# Patient Record
Sex: Male | Born: 1935 | Race: White | Hispanic: No | Marital: Married | Smoking: Never smoker
Health system: Southern US, Community
[De-identification: ages and names within clinical notes are randomized; demographics above are authoritative.]

## PROBLEM LIST (undated history)

## (undated) DIAGNOSIS — D62 Acute posthemorrhagic anemia: Secondary | ICD-10-CM

## (undated) DIAGNOSIS — M1990 Unspecified osteoarthritis, unspecified site: Secondary | ICD-10-CM

## (undated) DIAGNOSIS — E7800 Pure hypercholesterolemia, unspecified: Secondary | ICD-10-CM

## (undated) DIAGNOSIS — K219 Gastro-esophageal reflux disease without esophagitis: Secondary | ICD-10-CM

## (undated) DIAGNOSIS — Z9289 Personal history of other medical treatment: Secondary | ICD-10-CM

## (undated) DIAGNOSIS — Z8619 Personal history of other infectious and parasitic diseases: Secondary | ICD-10-CM

## (undated) DIAGNOSIS — E871 Hypo-osmolality and hyponatremia: Secondary | ICD-10-CM

## (undated) DIAGNOSIS — L0390 Cellulitis, unspecified: Secondary | ICD-10-CM

## (undated) HISTORY — PX: HERNIA REPAIR: SHX51

## (undated) HISTORY — DX: Gastro-esophageal reflux disease without esophagitis: K21.9

## (undated) HISTORY — DX: Pure hypercholesterolemia, unspecified: E78.00

## (undated) HISTORY — DX: Personal history of other medical treatment: Z92.89

## (undated) HISTORY — DX: Personal history of other infectious and parasitic diseases: Z86.19

## (undated) HISTORY — DX: Acute posthemorrhagic anemia: D62

## (undated) HISTORY — DX: Cellulitis, unspecified: L03.90

## (undated) HISTORY — DX: Unspecified osteoarthritis, unspecified site: M19.90

## (undated) HISTORY — PX: KNEE SURGERY: SHX244

## (undated) HISTORY — DX: Hypo-osmolality and hyponatremia: E87.1

## (undated) HISTORY — PX: TRANSURETHRAL RESECTION OF PROSTATE: SHX73

---

## 1997-12-15 ENCOUNTER — Ambulatory Visit (HOSPITAL_COMMUNITY): Admission: RE | Admit: 1997-12-15 | Discharge: 1997-12-15 | Payer: Self-pay | Admitting: Family Medicine

## 1998-05-12 ENCOUNTER — Ambulatory Visit (HOSPITAL_COMMUNITY): Admission: RE | Admit: 1998-05-12 | Discharge: 1998-05-12 | Payer: Self-pay | Admitting: Surgery

## 1999-08-23 ENCOUNTER — Emergency Department (HOSPITAL_COMMUNITY): Admission: EM | Admit: 1999-08-23 | Discharge: 1999-08-23 | Payer: Self-pay | Admitting: Emergency Medicine

## 2002-09-30 ENCOUNTER — Encounter (INDEPENDENT_AMBULATORY_CARE_PROVIDER_SITE_OTHER): Payer: Self-pay | Admitting: Specialist

## 2002-09-30 ENCOUNTER — Ambulatory Visit (HOSPITAL_COMMUNITY): Admission: RE | Admit: 2002-09-30 | Discharge: 2002-09-30 | Payer: Self-pay | Admitting: Gastroenterology

## 2004-12-06 ENCOUNTER — Ambulatory Visit (HOSPITAL_COMMUNITY): Admission: RE | Admit: 2004-12-06 | Discharge: 2004-12-06 | Payer: Self-pay | Admitting: Gastroenterology

## 2005-01-18 ENCOUNTER — Encounter (INDEPENDENT_AMBULATORY_CARE_PROVIDER_SITE_OTHER): Payer: Self-pay | Admitting: *Deleted

## 2005-01-18 ENCOUNTER — Inpatient Hospital Stay (HOSPITAL_COMMUNITY): Admission: RE | Admit: 2005-01-18 | Discharge: 2005-01-20 | Payer: Self-pay | Admitting: Urology

## 2008-09-19 ENCOUNTER — Inpatient Hospital Stay (HOSPITAL_COMMUNITY): Admission: RE | Admit: 2008-09-19 | Discharge: 2008-09-24 | Payer: Self-pay | Admitting: Orthopedic Surgery

## 2010-10-11 LAB — COMPREHENSIVE METABOLIC PANEL
ALT: 22 U/L (ref 0–53)
Alkaline Phosphatase: 56 U/L (ref 39–117)
CO2: 29 mEq/L (ref 19–32)
GFR calc non Af Amer: 50 mL/min — ABNORMAL LOW (ref >60)
Glucose, Bld: 82 mg/dL (ref 70–99)
Potassium: 4.5 mEq/L (ref 3.5–5.1)
Sodium: 142 mEq/L (ref 135–145)
Total Protein: 6.5 g/dL (ref 6.0–8.3)

## 2010-10-11 LAB — BASIC METABOLIC PANEL
BUN: 13 mg/dL (ref 6–23)
CO2: 25 mEq/L (ref 19–32)
CO2: 28 mEq/L (ref 19–32)
CO2: 28 mEq/L (ref 19–32)
Calcium: 7.7 mg/dL — ABNORMAL LOW (ref 8.4–10.5)
Calcium: 7.7 mg/dL — ABNORMAL LOW (ref 8.4–10.5)
Calcium: 7.9 mg/dL — ABNORMAL LOW (ref 8.4–10.5)
Chloride: 101 mEq/L (ref 96–112)
Chloride: 99 mEq/L (ref 96–112)
Creatinine, Ser: 1.13 mg/dL (ref 0.4–1.5)
GFR calc Af Amer: 51 mL/min — ABNORMAL LOW (ref >60)
GFR calc Af Amer: 59 mL/min — ABNORMAL LOW (ref >60)
GFR calc Af Amer: >60 mL/min (ref >60)
GFR calc Af Amer: >60 mL/min (ref >60)
GFR calc Af Amer: >60 mL/min (ref >60)
GFR calc non Af Amer: 48 mL/min — ABNORMAL LOW (ref >60)
GFR calc non Af Amer: 57 mL/min — ABNORMAL LOW (ref >60)
GFR calc non Af Amer: 59 mL/min — ABNORMAL LOW (ref >60)
GFR calc non Af Amer: >60 mL/min (ref >60)
Glucose, Bld: 116 mg/dL — ABNORMAL HIGH (ref 70–99)
Glucose, Bld: 176 mg/dL — ABNORMAL HIGH (ref 70–99)
Glucose, Bld: 189 mg/dL — ABNORMAL HIGH (ref 70–99)
Potassium: 3.8 mEq/L (ref 3.5–5.1)
Potassium: 3.9 mEq/L (ref 3.5–5.1)
Potassium: 4.3 mEq/L (ref 3.5–5.1)
Potassium: 4.6 mEq/L (ref 3.5–5.1)
Potassium: 5.4 mEq/L — ABNORMAL HIGH (ref 3.5–5.1)
Sodium: 130 mEq/L — ABNORMAL LOW (ref 135–145)
Sodium: 131 mEq/L — ABNORMAL LOW (ref 135–145)
Sodium: 132 mEq/L — ABNORMAL LOW (ref 135–145)
Sodium: 132 mEq/L — ABNORMAL LOW (ref 135–145)
Sodium: 135 mEq/L (ref 135–145)
Sodium: 137 mEq/L (ref 135–145)

## 2010-10-11 LAB — CBC
HCT: 25 % — ABNORMAL LOW (ref 39.0–52.0)
HCT: 28.9 % — ABNORMAL LOW (ref 39.0–52.0)
HCT: 29.3 % — ABNORMAL LOW (ref 39.0–52.0)
HCT: 29.8 % — ABNORMAL LOW (ref 39.0–52.0)
Hemoglobin: 10.2 g/dL — ABNORMAL LOW (ref 13.0–17.0)
Hemoglobin: 13.7 g/dL (ref 13.0–17.0)
Hemoglobin: 8.6 g/dL — ABNORMAL LOW (ref 13.0–17.0)
MCHC: 34.5 g/dL (ref 30.0–36.0)
MCV: 96.3 fL (ref 78.0–100.0)
Platelets: 136 10*3/uL — ABNORMAL LOW (ref 150–400)
Platelets: 208 10*3/uL (ref 150–400)
RBC: 2.56 MIL/uL — ABNORMAL LOW (ref 4.22–5.81)
RBC: 3.09 MIL/uL — ABNORMAL LOW (ref 4.22–5.81)
RBC: 4.2 MIL/uL — ABNORMAL LOW (ref 4.22–5.81)
RDW: 12.9 % (ref 11.5–15.5)
RDW: 12.9 % (ref 11.5–15.5)
RDW: 13.2 % (ref 11.5–15.5)
WBC: 4.6 10*3/uL (ref 4.0–10.5)
WBC: 6.9 10*3/uL (ref 4.0–10.5)
WBC: 8 10*3/uL (ref 4.0–10.5)
WBC: 9.6 10*3/uL (ref 4.0–10.5)

## 2010-10-11 LAB — URINALYSIS, ROUTINE W REFLEX MICROSCOPIC
Nitrite: NEGATIVE
Specific Gravity, Urine: 1.018 (ref 1.005–1.030)
pH: 5.5 (ref 5.0–8.0)

## 2010-10-11 LAB — PROTIME-INR
INR: 1.3 (ref 0.00–1.49)
INR: 1.5 (ref 0.00–1.49)
INR: 1.6 — ABNORMAL HIGH (ref 0.00–1.49)
INR: 1.6 — ABNORMAL HIGH (ref 0.00–1.49)
Prothrombin Time: 14.2 seconds (ref 11.6–15.2)
Prothrombin Time: 16.5 seconds — ABNORMAL HIGH (ref 11.6–15.2)
Prothrombin Time: 18.5 seconds — ABNORMAL HIGH (ref 11.6–15.2)
Prothrombin Time: 19.4 seconds — ABNORMAL HIGH (ref 11.6–15.2)

## 2010-10-11 LAB — PREPARE RBC (CROSSMATCH)

## 2010-10-11 LAB — DIFFERENTIAL
Basophils Absolute: 0 10*3/uL (ref 0.0–0.1)
Eosinophils Relative: 0 % (ref 0–5)
Lymphocytes Relative: 10 % — ABNORMAL LOW (ref 12–46)
Lymphs Abs: 1 10*3/uL (ref 0.7–4.0)
Neutro Abs: 7.9 10*3/uL — ABNORMAL HIGH (ref 1.7–7.7)

## 2010-10-11 LAB — TYPE AND SCREEN: Antibody Screen: NEGATIVE

## 2010-11-13 NOTE — Op Note (Signed)
Justin Mccarty, Justin Mccarty                 ACCOUNT NO.:  [id number]   MEDICAL RECORD NO.:  [id number]          PATIENT TYPE:  INP   LOCATION:  0008                         FACILITY:  [facility]   PHYSICIAN:  Ollen Gross, M.D.    DATE OF BIRTH:  1935-07-06   DATE OF PROCEDURE:  09/19/2008  DATE OF DISCHARGE:                               OPERATIVE REPORT   PREOPERATIVE DIAGNOSIS:  Osteoarthritis, bilateral knees.   POSTOPERATIVE DIAGNOSIS:  Osteoarthritis, bilateral knees.   PROCEDURE:  Bilateral total knee arthroplasty.   SURGEON:  Ollen Gross, M.D.   ASSISTANT:  Alexzandrew L. Perkins, P.A.C.   ANESTHESIA:  Spinal epidural.   ESTIMATED BLOOD LOSS:  Minimal.   DRAINS:  Autovac x2, one on each side.   TOURNIQUET TIME:  On the left 47 at 300 mmHg.  On the right 41 at 300  mmHg.   COMPLICATIONS:  None.   CONDITION:  Stable to recovery room.   BRIEF CLINICAL NOTE:  Justin Mccarty is a 75 year old male with severe end-  stage arthritis in both knees, the left slightly more symptomatic than  the right, but both highly symptomatic.  He has failed nonoperative  management and presents now for bilateral total knee arthroplasty.   PROCEDURE IN DETAIL:  After successful administration of combined spinal  and epidural anesthetic, the patient had his tourniquets placed high on  both thighs and both lower extremities were prepped and draped in the  usual sterile fashion.  We did the left side first because he was more  symptomatic on the left.  The left lower extremity was wrapped in  Esmarch, knee flexed, tourniquet inflated to 300 mmHg.  A midline  incision was made with 10 blade through the subcutaneous tissue to the  level of the extensor mechanism.  A fresh blade was used to make a  medial parapatellar arthrotomy.  The soft tissue of the proximal medial  tibia was subperiosteally elevated to the joint line with the knife and  into the semimembranosus bursa with a Cobb elevator.  The soft  tissue  laterally was elevated with attention being paid to avoid the patellar  tendon on the tibial tubercle.  The patella was subluxed laterally, knee  flexed 90 degrees and ACL and PCL removed.  The drill was used create a  starting hole in the distal femur and the canal was thoroughly  irrigated.  The 5 degrees  left valgus alignment guide was placed and  referencing off the posterior condyles rotation was marked and a block  pinned to remove 11 mm of the distal femur.  I took 11 because of a  preop flexion contracture.  The distal femoral resection was made with  an oscillating saw.  The sizing block was placed and size 5 was most  appropriate.  Rotation was marked off the epicondylar axis.  A size 5  cutting block was placed and the anterior, posterior and chamfer cuts  were made.   The tibia was subluxed forward and the menisci removed.  The  extramedullary tibial alignment guide was placed, referencing proximally  at the  medial aspect of the tibial tubercle and distally along the  second metatarsal axis and tibial crest.  The block was pinned to remove  about 10 mm of the non-deficient lateral side.  Tibial resection was  made with an oscillating saw.  A size 4 was the most appropriate tibial  component and the proximal tibia was prepared with the modular drill and  keel punch for the size 4.  Femoral preparation was completed the  intercondylar cut for the size 5.   A size 4 mobile bearing tibial trial, a size 5 posterior stabilized  femoral trial and the 10 mm posterior stabilized rotating platform  insert trial are placed.  With the 10 full extension was achieved with  excellent varus, valgus, anterior and posterior balance throughout full  range of motion.  The patella was then everted, thickness measured to 27  mm.  A freehand resection was taken to 15 mm, a 41 template was placed,  lug holes were drilled, trial patella was placed and it tracks normally.  Osteophytes were  removed off the posterior femur with the trial in  place.  All trials were removed and the cut bone surfaces were prepared  with pulsatile lavage.  Cement was mixed and once ready for implantation  the size 5 posterior stabilized femur, size 4 mobile bearing tibial tray  and 41 patella were cemented into place.  The patella was held with a  clamp.  A trial 10 mm insert was placed, knee held in full extension and  all extruded cement removed.  When the cement was fully hardened then  the permanent 10 mm posterior stabilized rotating platform insert was  placed into the tibial tray.  The wound was copiously irrigated with  saline solution and then the arthrotomy closed over an Autovac drain  with interrupted #1 PDS.  Flexion against gravity was 140 degrees.  The  tourniquet was released for total time of 47 minutes.  The subcu was  then closed with interrupted 2-0 Vicryl and subcuticular running 4-0  Monocryl.  A moist sponge was placed and then the right lower extremity  was addressed.   The right leg was wrapped in Esmarch, knee flexed, tourniquet inflated  to 300 mmHg.  A standard midline incision was made with a 10 blade  through subcutaneous tissue to the level of the extensor mechanism.  A  medial arthrotomy was performed with a fresh blade and soft tissue  releases were performed.  The knee was flexed to 90 degrees.  The  patella was subluxed laterally and ACL and PCL were removed.  The drill  was used create a starting hole in the distal femur and the canal was  thoroughly irrigated.  The 5 degrees right valgus alignment guide was  placed referencing off the posterior condyle, the rotation was marked  and a block pinned to remove 10 mm of the distal femur.  The distal  femoral resection was made with an oscillating saw.  The sizing block  was placed.  A size 5 was the most appropriate.  Rotation was marked at  the epicondylar axis.  A size 5 cutting block was placed and the   anterior, posterior and chamfer cuts were made.   The tibia was subluxed forward and menisci removed.  The extramedullary  tibial alignment guide was placed referencing proximally at the medial  aspect of the tibial tubercle and distally along the second metatarsal  axis and tibial crest.  The block was pinned to  remove 10 mm from the  non-deficient lateral side.  Tibial resection was performed with an  oscillating saw.  A size 4 was the most appropriate tibial component and  the proximal tibia was prepared with the modular drill and keel punch  for the size 4.  Femoral preparation was completed the intercondylar cut  for the size 5.   A size 4 mobile bearing tibial trial, size 5 posterior stabilized  femoral trial and a 10 mm posterior stabilized rotating platform insert  trial are placed.  With the 10 full extension was achieved with  excellent varus, valgus and anterior and posterior balance throughout  full range of motion.  The patella was everted, thickness measured to be  27 mm.  The freehand resection taken to 15 mm, 41 template was placed,  lug holes were drilled, trial patella was placed and it tracks normally.  Osteophytes were removed off the posterior femur with the trial in  place.  All trials were removed and the cut bone surfaces were prepared  with pulsatile lavage.  Cement was mixed and once ready for implantation  the size 4 mobile bearing tibial, tray size 5 posterior stabilized femur  and 41 patella were cemented into place and the patella was held with a  clamp.  The trial 10 mm insert was placed, the knee held in full  extension and all extruded cement removed.  Once the cement was fully  hardened then the permanent 10 mm posterior stabilized rotating platform  insert was placed into the tibial tray.  The wound was copiously  irrigated with saline solution and then the arthrotomy closed with  interrupted #1 PDS.  Flexion against gravity was about 140 degrees.   The  subcu was closed with interrupted 2-0 Vicryl and subcuticular running 4-  0 Monocryl.  The incision was cleaned and dried and Steri-Strips and a  bulky sterile dressing were applied.  On the left we then placed Steri-  Strips and bulky sterile dressing also.  He was placed into bilateral  knee immobilizers, awakened and transported to recovery in stable  condition.      Ollen Gross, M.D.  Electronically Signed     FA/MEDQ  D:  09/19/2008  T:  09/19/2008  Job:  [id number]

## 2010-11-13 NOTE — H&P (Signed)
Justin, Mccarty                 ACCOUNT NO.:  [id number]   MEDICAL RECORD NO.:  [id number]          PATIENT TYPE:  INP   LOCATION:                               FACILITY:  [facility]   PHYSICIAN:  Ollen Gross, M.D.    DATE OF BIRTH:  10-Jun-1936   DATE OF ADMISSION:  09/19/2008  DATE OF DISCHARGE:                              HISTORY & PHYSICAL   DATE OF OFFICE VISIT/HISTORY AND PHYSICAL:  September 15, 2008.   DATE OF ADMISSION:  September 19, 2008.   CHIEF COMPLAINT:  Bilateral knee pain.   HISTORY OF PRESENT ILLNESS:  The patient is a 75 year old male who was  seen by Dr. Lequita Halt in consultation for bilateral knee pain.  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  Over time the knees have progressively gotten worse.  He is seen  in the office and found to have end-stage arthritis of both knees.  He  is at a point where he would now like to have something done about it.  Risks and benefits have been discussed.  He elects to proceed to  surgery.  He has been seen by Dr. Clarene Duke and also by Dr. Swaziland for a  cardiac clearance and has elected to proceed with surgery.   ALLERGIES:  No known drug allergies.   CURRENT MEDICATIONS:  Glucosamine, omega III fish oil and ibuprofen.   PAST MEDICAL HISTORY:  History of bronchitis, reflux disease, history of  phlebitis.   PAST SURGICAL HISTORY:  Hernia repair 2002 and TURP procedure in 2006.   FAMILY HISTORY:  Sister with colon cancer.   SOCIAL HISTORY:  Married, Music therapist.  Nonsmoker, no  alcohol.  Two children.  Wife will be assisting with care after surgery,  He has a one-story home with one step.   REVIEW OF SYSTEMS:  GENERAL:  No fevers, chills, night sweats.  NEURO:  Little bit of hearing loss.  No seizures, syncope or paralysis.  RESPIRATORY:  No shortness breath, productive cough or hemoptysis.  CARDIOVASCULAR:  No chest pain, angina, or orthopnea.  GI:  No nausea,  vomiting, diarrhea, constipation.  GU:  No dysuria, hematuria or  discharge.  MUSCULOSKELETAL:  Bilateral knee pain.   PHYSICAL EXAMINATION:  VITAL SIGNS:  Pulse 64, respirations 14, blood  pressure 126/72.  GENERAL:  75 year old white male, well-nourished, well-developed, no  acute distress.  He is alert, oriented and cooperative, pleasant,  accompanied by his wife.  HEENT:  Normocephalic, atraumatic.  Pupils are round and reactive.  EOMs  intact.  NECK:  Supple.  No carotid bruits.  CHEST:  Clear anterior and posterior chest walls.  No rhonchi, rales or  wheezing.  HEART:  Regular rate and rhythm.  No murmur, S1, S2 noted.  ABDOMEN:  Soft, nontender.  Bowel sounds present.  BREASTS/RECTAL/GENITALIA:  Not done.  Not pertinent to present illness.  EXTREMITIES:  Right knee:  Valgus malalignment 40, marked crepitus.  Range of motion  5-125.  Left knee:  Valgus malalignment deformity,  marked crepitus.  Range of motion 10-120.   IMPRESSION:  Osteoarthritis bilateral knees.   PLAN:  The patient will be admitted to [facility] to undergo a  bilateral total knee arthroplasty procedure.  Surgery will be performed  by Dr. Ollen Gross.      Justin Mccarty, P.A.C.      Ollen Gross, M.D.  Electronically Signed    ALP/MEDQ  D:  09/18/2008  T:  09/18/2008  Job:  [id number]   cc:   Ollen Gross, M.D.  Fax: [phone]   Aida Puffer, Dr.  [facility]  [address]  [place], [place] [address]  Phone:  [phone]   Peter M. Swaziland, M.D.  Fax: [phone]

## 2010-11-16 NOTE — Op Note (Signed)
   NAME:  Justin Mccarty, Justin Mccarty                           ACCOUNT NO.:  [id number]   MEDICAL RECORD NO.:  [id number]                   PATIENT TYPE:  AMB   LOCATION:  ENDO                                 FACILITY[facility]  [facility]   PHYSICIAN:  John C. Madilyn Fireman, M.D.                 DATE OF BIRTH:  06-08-1936   DATE OF PROCEDURE:  09/30/2002  DATE OF DISCHARGE:                                 OPERATIVE REPORT   PROCEDURE:  Colonoscopy with polypectomy.   INDICATIONS:  Heme-positive stools and a family history of colon cancer in a  first degree relative.   DESCRIPTION OF PROCEDURE:  The patient was placed in the left lateral  decubitus position and placed on the pulse monitor with continuous low-flow  oxygen delivered by nasal cannula.  He was sedated with 50 mg IV Demerol and  4 mg IV Versed.  The Olympus video colonoscope was inserted into the rectum  and advanced to the cecum, confirmed by transillumination of McBurney's  point and visualization of the ileocecal valve and appendiceal orifice.  The  prep was excellent.  The cecum, ascending, transverse, and descending colon  appeared normal with no masses, polyps, diverticula, or other mucosal  abnormalities.  There were a few small sigmoid diverticula noted.  At the  rectosigmoid junction with the proximal rectum, there was a vaguely-  delineated, relatively flat sessile polyp approximately 1.25 cm in diameter.  I elected to inject 2 mL of normal saline underneath it to elevate it and  then removed it in one piece with the snare.  There was a 6 mm sessile polyp  about 5 cm distal to the larger polyp, and it was fulgurated by hot biopsy.  The remainder of the rectum appeared normal.  The scope was then withdrawn  and the patient returned to the recovery room in stable condition.  He  tolerated the procedure well, and there were no immediate complications.   IMPRESSION:  1. Sessile rectosigmoid polyp.  2. Diminutive rectal polyp.   PLAN:  Await  histology to determine method and interval for future colon  screening.                                               John C. Madilyn Fireman, M.D.    JCH/MEDQ  D:  09/30/2002  T:  09/30/2002  Job:  [id number]   cc:   Aida Puffer  [address]  [place]  [place] [address]  Fax: [phone]

## 2010-11-16 NOTE — Discharge Summary (Signed)
[name], [name]                 ACCOUNT NO.:  [id number]   MEDICAL RECORD NO.:  [id number]          PATIENT TYPE:  INP   LOCATION:  1614                         [facility]ACILITY:  [facility]   PHYSICIAN:  Ollen Gross, M.D.    DATE OF BIRTH:  1936-05-15   DATE OF ADMISSION:  09/19/2008  DATE OF DISCHARGE:  09/24/2008                               DISCHARGE SUMMARY   ADMISSION DIAGNOSES:  1. Osteoarthritis of bilateral knees.  2. History of bronchitis.  3. Reflux disease.  4. History of phlebitis.   DISCHARGE DIAGNOSES:  1. Osteoarthritis of bilateral knees status post bilateral total      replacement arthroplasty.  2. Postoperative acute blood loss anemia.  3. Status post transfusion without sequelae.  4. Postoperative transient hyperkalemia, resolved.  5. Postoperative hyponatremia, improving.  6. History of bronchitis.  7. Reflux disease.  8. History of phlebitis.   PROCEDURE:  On September 19, 2008:  Bilateral total knee replacement  arthroplasty.   SURGEON:  Ollen Gross, M.D.   ASSISTANT:  Alexzandrew L. Perkins, P.A.C.   ANESTHESIA:  Spinal anesthesia supplemented by epidural per anesthesia.   CONSULTATIONS:  Anesthesia Department for epidural maintenance.   BRIEF HISTORY:  Justin Mccarty is a 75 year old male with end-stage  arthritis of both knees, left slightly more than the right, but both  highly symptomatic, failed operative management and now presents for  total knee arthroplasty.   LABORATORY DATA:  Preoperative CBC showed a hemoglobin of 13.7,  hematocrit of 39.7, white cell count 406, red blood cell 188,  postoperative hemoglobin 10.2, drifting down to 8.6, given 2 units of  blood post-procedure, hemoglobin back up to 10, last known H and H 9.7  and 28.9, pro-time INR preoperative 14.2 and 1.1 with a PTT of 25.  Serial pro-times followed per Coumadin protocol.  Last known PT/INR 17.0  and 1.3.  Chem panel on admission all within normal limits.  Serial  BMETs were  followed.  Potassium did go up from 4.5 to 5.4.  Specimen was  hemolyzed, so its transient, back down to 4.3.  Potassium came down to  normal at 3.9.  Sodium started at 142, got as low as 130, back up to  132.  Preop UA negative.  Blood type A+.   DIAGNOSTICS:  EKG dated September 08, 2008:  Sinus rhythm, nonspecific ST/T  wave changes, possible anterior ischemia, confirmed by Dr. Clarene Duke.   HOSPITAL COURSE:  The patient was admitted to [facility],  taken to the OR and underwent the above stated procedure without  complication.  The patient tolerated the procedure well.  Later  transferred to the recovery room on the orthopedic floor.  He had  epidural placed at the time of surgery that assisted in postop pain  management.  He was also given PCA for postoperative control.  He had a  blood draw which did show on day 1 an elevated potassium of 5.4 felt to  be powerfully hemolyzed.  We rechecked it and it was normal.  Started on  Coumadin on the evening of day 1 with the  epidural.  We were not going  to start the Lovenox till the epidural was out.  He got out of bed on  day 1.  By day 2, he was doing pretty well.  One leg was a little bit  more sore on the right as opposed to the left, but he was actually  getting excellent control per the epidural.  He was seen on rounds on  day 2 and his INR went up to 1.7 indicating he was very sensitive to the  Coumadin and responded to it very quickly, faster than usual.  Anesthesia did elevate him and was concerned about pulling the epidural  with the INR up to 1.7, so he was given a dose of vitamin K to bring it  back down to a normal level, that was rechecked.  Wanted to wait until  his epidural was out before we started the Lovenox.  Both dressings were  changed and both incisions were healing very well.  Luckily, the INR did  come back down and the epidural was pulled by day 3 by anesthesia.  So  we waited 6 hours until after the epidural was  pulled.  We started his  Lovenox on day 3.  Both incisions were healing well.  It was noted  though on postop day 2, that his hemoglobin was down to 8.6.  Due to the  significant change in his hemoglobin down to 8.6 and also having  bilateral total knees, it was felt he would best be served by undergoing  transfusion.  He did receive 2 units of blood.  On the morning of day 3,  his hemoglobin was back up to 10.   Unfortunately with all the fluids that he got postoperatively for the  first several days, his sodium diluted down to 131.  We KVO'd his  fluids.  Once he was off the PCA, we discontinued his fluids and  rechecked his hemoglobin and his sodium.  His hemoglobin was doing well  at 9.7.  His sodium had come back up to 131, it was last noted at 132.  He was doing pretty well with his therapy, slowly progressing.  By day 3  and day 4, he was up walking about 150 feet.  Incisions were healing  well.  Then eventually he got up to walking about 200 feet.  His PCA was  eventually pulled on the morning of day 4.  His sodium was better.  Incisions continued to heal well.  Once he was able to tolerate his p.o.  pain medications for about 24 hours, on the morning of September 24, 2008,  he was seen back in rounds by Dr. Lequita Halt and he was ready to go home.  Unfortunately, his INR had not come back up to a therapeutic range.  He  was still about 1.3.  He was already on Lovenox and then continued  Lovenox for several days at home.  The patient was doing well.   DISPOSITION:  He was discharged home on September 24, 2008.   DISCHARGE MEDICATIONS:  1. Percocet.  2. Robaxin.  3. Coumadin.  4. Lovenox.   DIET:  As tolerated.   ACTIVITY:  He is weightbearing as tolerated, total knee protocol.  Home  health PT and home health nursing.  Home health PT for range of motion,  strengthening exercises.   FOLLOW UP:  Follow up in 2 weeks from date of surgery.   CONDITION ON DISCHARGE:   Improving.  Alexzandrew L. Perkins, P.A.C.      Ollen Gross, M.D.  Electronically Signed    ALP/MEDQ  D:  10/06/2008  T:  10/06/2008  Job:  [id number]   cc:   Ollen Gross, M.D.  Fax: [phone]   Aida Puffer  Fax: [phone]   Peter M. Swaziland, M.D.  Fax: [phone]

## 2010-11-16 NOTE — Op Note (Signed)
NAME:  Justin Mccarty, Justin Mccarty                 ACCOUNT NO.:  [id number]   MEDICAL RECORD NO.:  [id number]          PATIENT TYPE:  AMB   LOCATION:  ENDO                         FACILITY:  [facility]   PHYSICIAN:  John C. Madilyn Fireman, M.D.    DATE OF BIRTH:  [birth year]   DATE OF PROCEDURE:  12/06/2004  DATE OF DISCHARGE:                                 OPERATIVE REPORT   PROCEDURE:  Esophagogastroduodenoscopy with biopsy.   INDICATIONS FOR PROCEDURE:  Heme-positive stools and history of adenomatous  colon polyps.   DESCRIPTION OF PROCEDURE:  The patient was placed in the left lateral  decubitus position and placed on the pulse monitor with continuous low-flow  oxygen delivered by nasal cannula. He was sedated with 50 mcg IV fentanyl  and 5 mg IV Versed. The Olympus video endoscope was advanced under direct  vision into the oropharynx and esophagus. The esophagus was straight and of  normal caliber with the squamocolumnar line at 38 cm. There was no visible  hiatal hernia,  ring stricture or other abnormality of the GE junction. The  stomach was entered and a small amount of liquid secretions were suctioned  from the fundus. Retroflexed view of cardia was unremarkable. The fundus and  body appeared normal. Within the antrum,  there were seen three fairly small  prepyloric ulcers with no pyloric deformity.  The largest of these was about  6 mm in diameter and had a small flat nonbleeding visible vessel. The other  two were approximately 3 and 4 mm in diameter with no stigma of hemorrhage.  A CLO-test was obtained. The duodenum was entered and both the bulb and  second portion were inspected and appeared to be within normal limits. The  scope was withdrawn back into the stomach and a CLO-test obtained. The scope  was then withdrawn and the patient returned to the recovery room in stable  condition. He tolerated the procedure well and there were no immediate  complications.   IMPRESSION:  Three prepyloric  gastric ulcers.   PLAN:  1.  Will treat with proton pump inhibitor and antibiotics if CLO-test is      positive.  2.  Proceed to colonoscopy.       JCH/MEDQ  D:  12/06/2004  T:  12/06/2004  Job:  [id number]   cc:   Aida Puffer  [address]  [place]  [place] [address]  Fax: [phone]

## 2010-11-16 NOTE — H&P (Signed)
NAMEJORON, VELIS                 ACCOUNT NO.:  [id number]   MEDICAL RECORD NO.:  [id number]          PATIENT TYPE:  INP   LOCATION:  0002                         FACILITY:  [facility]   PHYSICIAN:  Jamison Neighbor, M.D.  DATE OF BIRTH:  1935-11-30   DATE OF ADMISSION:  01/18/2005  DATE OF DISCHARGE:                                HISTORY & PHYSICAL   HISTORY OF PRESENT ILLNESS:  This 75 year old male has had problems with  bladder outlet symptoms. He has been on a combination of Proscar and  Avodart. He was recently found to have some nodularity of the prostate and  underwent a biopsy which was negative for cancer. Because the patient's  residual urine has gotten as high as 358 mL, it was felt that he needed to  have something done. He did not like the idea of TUNA or microwave as he  wished to have the most complete procedure as possible and he has elected to  undergo a TURP. He is to be admitted following that procedure.   The patient's past medical history is otherwise quite unremarkable. His only  medications are Flomax and Avodart, both of which can be discontinued  following the TURP. He has no significant health problems. He did have  esophageal reflux in the past. He has been under the care of Dr. Dorena Cookey  who recently performed an endoscopy and colonoscopy. The patient's previous  surgery includes double hernia repair a number of years ago as well as knee  surgery approximately 35 years ago.   SOCIAL HISTORY:  Unremarkable. He does not use tobacco or alcohol.   FAMILY HISTORY:  Reviewed and were noncontributory.   REVIEW OF SYMPTOMS:  Reviewed and were noncontributory.   PHYSICAL EXAMINATION:  GENERAL:  He is a well-developed, well-nourished male  in no acute distress.  VITAL SIGNS:  Temperature 97, pulse 68, respirations 16, blood pressure  128/64.  HEENT:  Normocephalic, atraumatic. Cranial nerves II-XII appear grossly  intact.  NECK:  Supple with no adenopathy or  thyromegaly.  HEART:  Regular rate and rhythm without murmurs, thrills, gallops, rubs, or  heaves.  LUNGS:  Clear.  ABDOMEN:  Soft, nontender with no palpable masses, rebound or guarding.  GU:  Shows normal testicles bilaterally with no hydrocele, spermatocele,  varicocele, hernia or adenopathy. Penis is free of any lesions.  RECTAL:  Shows a 2+ prostate previously biopsied and shown to be benign. The  sphincter tone is normal.  EXTREMITIES:  No cyanosis, clubbing or edema.  NEUROLOGIC/VASCULAR:  Intact.   IMPRESSION:  Benign prostatic hypertrophy with bladder outlet obstruction  with high post void residual of 358 mL.   PLAN:  Admit for TURP.     _______________    RJE/MEDQ  D:  01/18/2005  T:  01/18/2005  Job:  [id number]

## 2010-11-16 NOTE — Op Note (Signed)
NAME:  Justin Mccarty, Justin Mccarty                 ACCOUNT NO.:  [id number]   MEDICAL RECORD NO.:  [id number]          PATIENT TYPE:  AMB   LOCATION:  ENDO                         FACILITY:  [facility]   PHYSICIAN:  John C. Madilyn Fireman, M.D.    DATE OF BIRTH:  May 28, 1936   DATE OF PROCEDURE:  12/06/2004  DATE OF DISCHARGE:                                 OPERATIVE REPORT   PROCEDURE:  Colonoscopy.   INDICATIONS FOR PROCEDURE:  History of adenomatous colon polyps with heme-  positive stool.   DESCRIPTION OF PROCEDURE:  The patient was placed in the left lateral  decubitus position and placed on the pulse monitor with continuous low-flow  oxygen delivered by nasal cannula. He was sedated with 50 mcg IV fentanyl  and 5 mg IV Versed for the previous EGD and no further sedation was required  for this procedure. The Olympus video colonoscope was inserted into the  rectum and advanced to the cecum, confirmed by transillumination at  McBurney's point and visualization of ileocecal valve and appendiceal  orifice. The prep was good. The cecum, ascending, transverse, descending and  sigmoid colon all appeared normal with no masses, polyps, diverticula or  other mucosal abnormalities. The rectum likewise appeared normal and  retroflexed view of the anus revealed no obvious internal hemorrhoids. The  scope was then withdrawn and the patient returned to the recovery room in  stable condition. He tolerated the procedure well. There were no immediate  complications.   IMPRESSION:  Normal colonoscopy.   PLAN:  Repeat colonoscopy in three years.       JCH/MEDQ  D:  12/06/2004  T:  12/06/2004  Job:  [id number]   cc:   Aida Puffer  [address]  [place]  [place] [address]  Fax: [phone]

## 2010-11-16 NOTE — Op Note (Signed)
Justin Mccarty, Justin Mccarty                 ACCOUNT NO.:  [id number]   MEDICAL RECORD NO.:  [id number]          PATIENT TYPE:  INP   LOCATION:  0002                         [facility]ACILITY:  [facility]   PHYSICIAN:  Jamison Neighbor, M.D.  DATE OF BIRTH:  1936-03-28   DATE OF PROCEDURE:  01/18/2005  DATE OF DISCHARGE:                                 OPERATIVE REPORT   SERVICE:  Urology.   PREOPERATIVE DIAGNOSIS:  Benign prostatic hypertrophy with bladder outlet  obstruction.   POSTOPERATIVE DIAGNOSIS:  Benign prostatic hypertrophy with bladder outlet  obstruction.   PROCEDURE:  Cystoscopy and TURP.   SURGEON:  Jamison Neighbor, M.D.   ANESTHESIA:  General.   COMPLICATIONS:  None.   DRAINS:  None.   BRIEF HISTORY:  This 75 year old male has a history of BPH with bladder  outlet obstruction. The patient has been on Proscar and Flomax. The patient  was recently found to have some nodularity on rectal examination. _TRUS  guided _________ biopsy was performed and there was no evidence of cancer.  The patient does have residual urines as high as 358 mL despite the use of  both Flomax and Avodart. Because of the high post void residual, he  certainly is not a candidate for anticholinergic therapy. The patient does  know that there are options available to him such as TUNA or microwave, but  he has elected to undergo a TURP in order to be sure that tissue is  available for the pathologist to review. He understands the risks and  benefits of the procedure including the chance of both impotence and  incontinence. Informed consent was obtained.   DESCRIPTION OF PROCEDURE:  After successful induction of general anesthesia,  the patient was placed in the dorsal lithotomy position, prepped with  Betadine and draped in the usual sterile fashion. The urethra was dilated to  30 [place] with [employer]. The continuous flow resectoscope sheath was  then inserted using a [place] obturator. The __________  resectoscope was  inserted with the 12 degree lens in place. The bladder was inspected and was  free of any tumor or stones. Both ureteral orifices were normal in  configuration and location. The patient was quite trabeculated with a very  tense and tight bladder neck. The prostate itself was not particularly  enlarged but the patient had showed clear evidence of bladder outlet  obstructive changes in the bladder. The median lobe was carefully resected  beginning at the 6 o'clock position extending back to the verumontanum. The  right lateral lobe was dissected starting at the roof of the prostate  extending down to the floor. The left lateral lobe was extended in identical  fashion. Resection was taken out as far as the verumontanum. Because of the  small nature of the prostate, extra care was taken to ensure that the  sphincter mechanism was not damaged in anyway. The chips were irrigated from  the bladder, adequate hemostasis was obtained. Final inspection showed a  wide open prostatic fossa with no evidence of undermining the bladder neck  or injury to the  trigone in anyway. The sphincter mechanism and veru  appeared to be intact. The prostatic fossa was wide open, rectal examination  showed little if any residual prostate tissue. The cystoscope was removed,  the 24 [place] three-way catheter was inserted and placed to straight  drainage and will be started on continuous bladder irrigation. The outflow  was clear.     _______________    RJE/MEDQ  D:  01/18/2005  T:  01/18/2005  Job:  [id number]

## 2012-04-01 ENCOUNTER — Encounter: Payer: Self-pay | Admitting: Cardiovascular Disease

## 2012-08-24 ENCOUNTER — Encounter: Payer: Self-pay | Admitting: Gastroenterology

## 2012-08-25 ENCOUNTER — Encounter: Payer: Self-pay | Admitting: Cardiology

## 2012-08-25 ENCOUNTER — Ambulatory Visit (INDEPENDENT_AMBULATORY_CARE_PROVIDER_SITE_OTHER): Payer: Medicare Other | Admitting: Cardiology

## 2012-08-25 VITALS — BP 116/70 | HR 76 | Ht 69.5 in | Wt 195.6 lb

## 2012-08-25 DIAGNOSIS — I498 Other specified cardiac arrhythmias: Secondary | ICD-10-CM

## 2012-08-25 DIAGNOSIS — R001 Bradycardia, unspecified: Secondary | ICD-10-CM | POA: Insufficient documentation

## 2012-08-25 NOTE — Progress Notes (Signed)
   Justin Mccarty Date of Birth: [birth year] Medical Record #[id number]  History of Present Illness: Justin Mccarty is seen today at the request of Dr. little for evaluation of bradycardia. He is a pleasant 77 year old white male that I saw about 4 years ago for preoperative clearance for knee surgery. At that time his cardiac evaluation was unremarkable. He had a sinus bradycardia at bedtime with a rate of 54 beats per minute. Recently he had a EKG done which showed a pulse rate of 55. Patient is on no rate slowing medications. He denies any symptoms of bradycardia. He specifically denies any palpitations, dizziness, chest pain, or dyspnea. His energy level is very good he remains very active.  Current Outpatient Prescriptions on File Prior to Visit  Medication Sig Dispense Refill  . Glucosamine-Vitamin D (GLUCOSAMINE PLUS VITAMIN D PO) Take by mouth daily.      Marland Kitchen ibuprofen (ADVIL,MOTRIN) 200 MG tablet Take 200 mg by mouth every 6 (six) hours as needed.      . Omega-3 Fatty Acids (FISH OIL PO) Take by mouth 2 (two) times daily.       No current facility-administered medications on file prior to visit.    No Known Allergies  Past Medical History  Diagnosis Date  . 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 Hypercholesterolemia     Mild  . Cellulitis     History of Cellulitis  . History of sepsis   . Acid reflux disease   . Acute blood loss anemia     Postoperative  . Transfusion history     Status post transfusion without sequelae  . Hyponatremia          Past Surgical History  Procedure Laterality Date  . Knee surgery    . Hernia repair      Abdominal  . Transurethral resection of prostate      History  Smoking status  . Never Smoker   Smokeless tobacco  . Not on file    History  Alcohol Use No    Family History  Problem Relation Age of Onset  . Bone cancer Mother   . Colon cancer Sister     Review of Systems: As noted in history of present illness..  All other systems  were reviewed and are negative.  Physical Exam: BP 116/70  Pulse 76  Ht 5' 9.5" (1.765 m)  Wt 195 lb 9.6 oz (88.724 kg)  BMI 28.48 kg/m2  SpO2 93% He is a pleasant white male in no acute distress. HEENT: Normal. No JVD or bruits. Lungs: Clear Cardiovascular: Regular rate and rhythm without gallop, murmur, or click. S1 and S2 are normal. Abdomen: Soft and nontender. No masses or bruits. Extremities: Normal pedal pulses. No edema. Neuro: Alert and oriented x3. Cranial nerves II through XII are intact. Gait is normal.  LABORATORY DATA: Repeat ECG today demonstrates sinus bradycardia with occasional PAC. Rate is 54 beats per minute. It is otherwise normal. Laboratory data from 06/30/2012 shows a normal chemistry panel and CBC. Total cholesterol 195, triglycerides 123, HDL 35, LDL 135. TSH was normal at 2.49.  Assessment / Plan: 1. Sinus bradycardia. Rate is in the physiologic range. Patient is asymptomatic. This was present at least in 2010. No further workup indicated unless patient develops symptoms.

## 2016-06-02 ENCOUNTER — Emergency Department (HOSPITAL_BASED_OUTPATIENT_CLINIC_OR_DEPARTMENT_OTHER): Payer: Medicare Other

## 2016-06-02 ENCOUNTER — Encounter (HOSPITAL_BASED_OUTPATIENT_CLINIC_OR_DEPARTMENT_OTHER): Payer: Self-pay | Admitting: *Deleted

## 2016-06-02 ENCOUNTER — Emergency Department (HOSPITAL_BASED_OUTPATIENT_CLINIC_OR_DEPARTMENT_OTHER)
Admission: EM | Admit: 2016-06-02 | Discharge: 2016-06-03 | Disposition: A | Discharge status: Home / Self Care | Payer: Medicare Other | Attending: Emergency Medicine | Admitting: Emergency Medicine

## 2016-06-02 DIAGNOSIS — T148XXA Other injury of unspecified body region, initial encounter: Secondary | ICD-10-CM

## 2016-06-02 DIAGNOSIS — Y939 Activity, unspecified: Secondary | ICD-10-CM | POA: Diagnosis not present

## 2016-06-02 DIAGNOSIS — Y999 Unspecified external cause status: Secondary | ICD-10-CM | POA: Diagnosis not present

## 2016-06-02 DIAGNOSIS — S0990XA Unspecified injury of head, initial encounter: Secondary | ICD-10-CM | POA: Diagnosis present

## 2016-06-02 DIAGNOSIS — W19XXXA Unspecified fall, initial encounter: Secondary | ICD-10-CM

## 2016-06-02 DIAGNOSIS — W01198A Fall on same level from slipping, tripping and stumbling with subsequent striking against other object, initial encounter: Secondary | ICD-10-CM | POA: Insufficient documentation

## 2016-06-02 DIAGNOSIS — S62111A Displaced fracture of triquetrum [cuneiform] bone, right wrist, initial encounter for closed fracture: Secondary | ICD-10-CM | POA: Insufficient documentation

## 2016-06-02 DIAGNOSIS — Y929 Unspecified place or not applicable: Secondary | ICD-10-CM | POA: Diagnosis not present

## 2016-06-02 DIAGNOSIS — S01511A Laceration without foreign body of lip, initial encounter: Secondary | ICD-10-CM | POA: Insufficient documentation

## 2016-06-02 DIAGNOSIS — Z23 Encounter for immunization: Secondary | ICD-10-CM | POA: Insufficient documentation

## 2016-06-02 DIAGNOSIS — S61411A Laceration without foreign body of right hand, initial encounter: Secondary | ICD-10-CM | POA: Diagnosis not present

## 2016-06-02 DIAGNOSIS — S60311A Abrasion of right thumb, initial encounter: Secondary | ICD-10-CM | POA: Insufficient documentation

## 2016-06-02 MED ORDER — LIDOCAINE-EPINEPHRINE 2 %-1:100000 IJ SOLN: 10.0000 mL | Freq: Once | INTRAMUSCULAR | Status: DC

## 2016-06-02 MED ORDER — LIDOCAINE-EPINEPHRINE (PF) 2 %-1:200000 IJ SOLN
INTRAMUSCULAR | Status: AC
  Filled 2016-06-02: qty 20

## 2016-06-02 NOTE — ED Provider Notes (Signed)
[facility] Provider Note   CSN: [id number] Arrival date & time: 06/02/16  2038  By signing my name below, I, Justin Mccarty, attest that this documentation has been prepared under the direction and in the presence of Nira ConnPedro Eduardo Cardama, MD. Electronically Signed: Doreatha MartinEva Mccarty, ED Scribe. 06/02/16. 9:37 PM.     History   Chief Complaint Chief Complaint  Patient presents with  . Fall    HPI Justin Mccarty is a 80 y.o. male who presents to the Emergency Department for evaluation of injuries s/p mechanical fall that occurred 1.5 hours ago. Pt states he tripped over a parking curb and fell forward, striking his face, chest, palms and knees on the concrete. He denies LOC. He was ambulatory after the fall without difficulty. Pt currently complains of abrasions to the lower lip, bilateral palms and knees. He also complains of chest wall pain. He also notes his lower central teeth seem a little loose. Pt is not currently on any anticoagulants. He denies neck pain, HA, leg pain, abdominal pain, hip pain, additional injuries.   The history is provided by the patient. No language interpreter was used.    Past Medical History:  Diagnosis Date  . Acid reflux disease   . Acute blood loss anemia    Postoperative  . Cellulitis    History of Cellulitis  . History of sepsis   . Hypercholesterolemia    Mild  . Hyponatremia       . 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 Transfusion history    Status post transfusion without sequelae    Patient Active Problem List   Diagnosis Date Noted  . Sinus bradycardia 08/25/2012    Past Surgical History:  Procedure Laterality Date  . HERNIA REPAIR     Abdominal  . KNEE SURGERY    . TRANSURETHRAL RESECTION OF PROSTATE         Home Medications    Prior to Admission medications   Medication Sig Start Date End Date Taking? Authorizing Provider  acetaminophen (TYLENOL) 500 MG tablet Take 2 tablets (1,000 mg total) by mouth every 8  (eight) hours. Do not take more than 4000 mg of acetaminophen (Tylenol) in a 24-hour period. Please note that other medicines that you may be prescribed may have Tylenol as well. 06/03/16 06/08/16  Nira ConnPedro Eduardo Cardama, MD  Cyanocobalamin (VITAMIN B 12 PO) Take by mouth.    Historical Provider, MD  Glucosamine-Vitamin D (GLUCOSAMINE PLUS VITAMIN D PO) Take by mouth daily.    Historical Provider, MD  ibuprofen (ADVIL,MOTRIN) 200 MG tablet Take 200 mg by mouth every 6 (six) hours as needed.    Historical Provider, MD  Omega-3 Fatty Acids (FISH OIL PO) Take by mouth 2 (two) times daily.    Historical Provider, MD    Family History Family History  Problem Relation Age of Onset  . Bone cancer Mother   . Colon cancer Sister     Social History Social History  Substance Use Topics  . Smoking status: Never Smoker  . Smokeless tobacco: Never Used  . Alcohol use No     Allergies   Patient has no known allergies.   Review of Systems Review of Systems A complete 10 system review of systems was obtained and all systems are negative except as noted in the HPI and PMH.    Physical Exam Updated Vital Signs BP 154/82 (BP Location: Right Arm)   Pulse 68   Temp 98.6 F (37  C) (Oral)   Resp 18   Ht 5\' 8"  (1.727 m)   Wt 190 lb (86.2 kg)   SpO2 97%   BMI 28.89 kg/m   Physical Exam  Constitutional: He is oriented to person, place, and time. He appears well-developed and well-nourished. No distress.  HENT:  Head: Normocephalic.  Right Ear: External ear normal.  Left Ear: External ear normal.  Mouth/Throat: Oropharynx is clear and moist.    4 cm laceration on the wet vermilion of the lower lip on the left that involves the vermilion border. No nasal septal hematoma. No racoon eyes, no battle's sign. No hemotympanum bilaterally.   Eyes: Conjunctivae and EOM are normal. Pupils are equal, round, and reactive to light. Right eye exhibits no discharge. Left eye exhibits no discharge. No scleral  icterus.  Neck: Normal range of motion. Neck supple.  Cardiovascular: Normal rate, regular rhythm and normal heart sounds.  Exam reveals no gallop and no friction rub.   No murmur heard. Pulses:      Radial pulses are 2+ on the right side, and 2+ on the left side.       Dorsalis pedis pulses are 2+ on the right side, and 2+ on the left side.  Pulmonary/Chest: Effort normal and breath sounds normal. No stridor. No respiratory distress. He exhibits tenderness.    Lower sternal TTP. No overlying contusions appreciated.  Abdominal: Soft. He exhibits no distension. There is no tenderness.  Musculoskeletal:       Cervical back: He exhibits no bony tenderness.       Thoracic back: He exhibits no bony tenderness.       Lumbar back: He exhibits no bony tenderness.       Hands: 6 cm laceration of the right thenar surface. Abrasion at the base of the right thumb. Contusion on the left thenar prominence. Small avulsion on the left distal thumb.   Pelvis stable. No C, T or L spine tenderness.   Neurological: He is alert and oriented to person, place, and time. GCS eye subscore is 4. GCS verbal subscore is 5. GCS motor subscore is 6.  Moving all extremities   Skin: Skin is warm. He is not diaphoretic.     ED Treatments / Results   DIAGNOSTIC STUDIES: Oxygen Saturation is 97% on RA, normal by my interpretation.    COORDINATION OF CARE: 9:26 PM Discussed treatment plan with pt at bedside which includes imaging, wound care and pt agreed to plan.    Labs (all labs ordered are listed, but only abnormal results are displayed) Labs Reviewed - No data to display  EKG  EKG Interpretation None       Radiology Dg Sternum  Result Date: 06/02/2016 CLINICAL DATA:  Tripped and fell in parking lot, with sternal pain. Initial encounter. EXAM: STERNUM - 2+ VIEW COMPARISON:  Chest radiograph performed 09/13/2008 FINDINGS: The lungs are well-aerated. Minimal bilateral atelectasis is noted. There is  no evidence of pleural effusion or pneumothorax. The heart is normal in size; the mediastinal contour is within normal limits. No acute osseous abnormalities are seen. The sternum appears intact. IMPRESSION: 1. Minimal bilateral atelectasis noted. Lungs otherwise clear. No displaced rib fracture seen. 2. The sternum appears intact. Electronically Signed   By: Roanna Raider M.D.   On: 06/02/2016 22:56   Dg Wrist Complete Right  Result Date: 06/03/2016 CLINICAL DATA:  Trip and fall injury in a parking lot tonight. EXAM: RIGHT WRIST - COMPLETE 3+ VIEW COMPARISON:  None.  FINDINGS: There is fragmentation at the dorsal aspect of the triquetrum, likely an acute fracture. No other area of suspicion for acute fracture. No dislocation. Severe osteoarthritic changes are present at the first carpometacarpal joint. IMPRESSION: Acute fracture of the dorsal triquetrum. Electronically Signed   By: Ellery Plunk M.D.   On: 06/03/2016 00:11   Ct Head Wo Contrast  Result Date: 06/02/2016 CLINICAL DATA:  Tripped over tire block in parking lot, face struck pavement. EXAM: CT HEAD WITHOUT CONTRAST TECHNIQUE: Contiguous axial images were obtained from the base of the skull through the vertex without intravenous contrast. COMPARISON:  None. FINDINGS: BRAIN: The ventricles and sulci are normal for age. No intraparenchymal hemorrhage, mass effect nor midline shift. Old RIGHT caudate head lacunar infarct. Old RIGHT cerebellar small infarct. Patchy supratentorial white matter hypodensities within normal range for patient's age, though non-specific are most compatible with chronic small vessel ischemic disease. No acute large vascular territory infarcts. No abnormal extra-axial fluid collections. Basal cisterns are patent. VASCULAR: Severe calcific atherosclerosis of the carotid siphons. SKULL: No skull fracture. No significant scalp soft tissue swelling. SINUSES/ORBITS: The mastoid air-cells and included paranasal sinuses are  well-aerated. Status post bilateral ocular lens implants. The included ocular globes and orbital contents are non-suspicious. OTHER: None. IMPRESSION: 1. No acute intracranial process. 2. 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 3. Moderate chronic small vessel ischemic disease. Electronically Signed   By: Awilda Metro M.D.   On: 06/02/2016 23:43   Dg Hand Complete Left  Result Date: 06/03/2016 CLINICAL DATA:  Tripped and fell in parking lot. Large RIGHT laceration. EXAM: LEFT HAND - COMPLETE 3+ VIEW COMPARISON:  None. FINDINGS: No acute fracture deformity or dislocation. Severe first carpometacarpal joint space narrowing with periarticular sclerosis and marginal spurring compatible with osteoarthrosis. Moderate second MTP osteoarthrosis. Moderate to severe third through fifth distal interphalangeal joint space osteoarthrosis. No destructive bony lesions. Mild vascular calcifications. Sub cm metallic foreign body projects in the dorsum of the third carpometacarpal joint space soft tissues. IMPRESSION: Degenerative changes without acute fracture deformity or dislocation. Sub cm metallic foreign body within dorsum of wrist. Electronically Signed   By: Awilda Metro M.D.   On: 06/03/2016 00:15   Dg Hand Complete Right  Result Date: 06/03/2016 CLINICAL DATA:  Trip and fall injury in a parking lot tonight EXAM: RIGHT HAND - COMPLETE 3+ VIEW COMPARISON:  None. FINDINGS: Fragmentation at the dorsal aspect of the triquetrum. No other acute fracture. Moderately severe arthritic changes of the first carpometacarpal joint and at many of the distal interphalangeal joints. No radiopaque foreign body. No dislocation. IMPRESSION: Fracture of the dorsal aspect of the triquetrum, seen to better advantage on the accompanying wrist radiographs. No other acute fracture. Moderately severe arthritic changes are present. Electronically Signed   By: Ellery Plunk M.D.   On: 06/03/2016 00:13     Procedures Procedures (including critical care time)  LACERATION REPAIR PROCEDURE NOTE The patient's identification was confirmed and consent was obtained. This procedure was performed by Nira Conn, MD at 1:09 AM. Site: right thenar surface  Sterile procedures observed Anesthetic used (type and amt): 5 mL of lidocaine with epi Suture type/size: 4-0 Prolene Length: 6 cm  # of Sutures: 9 Technique: Simple interrupted, horizontal mattress Complexity simple Antibx ointment applied Tetanus  Ordered  Site anesthetized, irrigated with NS, explored without evidence of foreign body, wound well approximated, site covered with dry, sterile dressing.  Patient tolerated procedure well without complications. Instructions for care discussed verbally  and patient provided with additional written instructions for homecare and f/u.   LACERATION REPAIR PROCEDURE NOTE The patient's identification was confirmed and consent was obtained. This procedure was performed by Nira ConnPedro Eduardo Cardama, MD at 1:09 AM. Site:  Left lower lip Sterile procedures observed Anesthetic used (type and amt): 3 mL of lidocaine with epi Suture type/size: 4-0 Vicryl Length: 4 cm # of Sutures: 6 Technique: Simple interrupted Complexity simple Antibx ointment applied Tetanus  ordered Site anesthetized, irrigated with NS, explored without evidence of foreign body, wound well approximated, site covered with dry, sterile dressing.  Patient tolerated procedure well without complications. Instructions for care discussed verbally and patient provided with additional written instructions for homecare and f/u.    Medications Ordered in ED Medications  lidocaine-EPINEPHrine (XYLOCAINE W/EPI) 2 %-1:100000 (with pres) injection 10 mL (not administered)  lidocaine-EPINEPHrine (XYLOCAINE W/EPI) 2 %-1:200000 (PF) injection (not administered)  Tdap (BOOSTRIX) injection 0.5 mL (not administered)     Initial  Impression / Assessment and Plan / ED Course  I have reviewed the triage vital signs and the nursing notes.  Pertinent labs & imaging results that were available during my care of the patient were reviewed by me and considered in my medical decision making (see chart for details).  Clinical Course     Imaging notable for right triquetrum fracture. Patient placed in a volar splint after laceration was sutured. Foreign body noted in left hand however no injuries to the hand, likely old. CT head without acute findings. Plain film of the sternum without fracture.  Safe for discharge with strict return precautions and close hand follow-up.  Final Clinical Impressions(s) / ED Diagnoses   Final diagnoses:  Fall  Fall, initial encounter  Abrasion  Lip laceration, initial encounter  Laceration of right hand without foreign body, initial encounter  Closed displaced fracture of triquetrum of right wrist, initial encounter   Disposition: Discharge  Condition: Good  I have discussed the results, Dx and Tx plan with the patient who expressed understanding and agree(s) with the plan. Discharge instructions discussed at great length. The patient was given strict return precautions who verbalized understanding of the instructions. No further questions at time of discharge.    New Prescriptions   ACETAMINOPHEN (TYLENOL) 500 MG TABLET    Take 2 tablets (1,000 mg total) by mouth every 8 (eight) hours. Do not take more than 4000 mg of acetaminophen (Tylenol) in a 24-hour period. Please note that other medicines that you may be prescribed may have Tylenol as well.    Follow Up: Dominica SeverinWilliam Gramig, MD [address] [place] [id number] [phone]  Call in 1 day For close follow up to assess for wrist bone fracture and hand laceration   I personally performed the services described in this documentation, which was scribed in my presence. The recorded information has been reviewed  and is accurate.        Nira ConnPedro Eduardo Cardama, MD 06/03/16 [phone]

## 2016-06-02 NOTE — ED Triage Notes (Addendum)
Mechanical fall~ 45 minutes PTA (2000), tripped over tire block in parking lot, (denies: LOC, nv, dizziness, visual changes, head neck or back pain), c/o bilateral hand pain, R hand avulsed (reported/bandaged), sternal tenderness (no abrasion or bruising), nose abrasion and lower lip deep abrasion, teeth not loose, "lower central seems to be maloccluded", TMJ intact w/o crepitus, hand bleeding controlled. No meds PTA. Alert, NAD, calm, interactive, speech clear, steady gait. Family present with pt.

## 2016-06-03 MED ORDER — TETANUS-DIPHTH-ACELL PERTUSSIS 5-2.5-18.5 LF-MCG/0.5 IM SUSP
0.5000 mL | Freq: Once | INTRAMUSCULAR | Status: AC
  Administered 2016-06-03: 0.5 mL via INTRAMUSCULAR
  Filled 2016-06-03: qty 0.5

## 2016-06-03 MED ORDER — ACETAMINOPHEN 500 MG PO TABS: 1000.0000 mg | ORAL_TABLET | Freq: Three times a day (TID) | ORAL | 0 refills | Status: AC

## 2018-01-28 ENCOUNTER — Other Ambulatory Visit: Payer: Self-pay | Admitting: Dermatology

## 2018-04-13 ENCOUNTER — Other Ambulatory Visit: Payer: Self-pay | Admitting: Dermatology

## 2019-07-28 ENCOUNTER — Ambulatory Visit: Payer: Medicare Other

## 2019-08-05 ENCOUNTER — Ambulatory Visit: Payer: Medicare Other | Attending: Internal Medicine

## 2019-08-05 DIAGNOSIS — Z23 Encounter for immunization: Secondary | ICD-10-CM | POA: Insufficient documentation

## 2019-08-05 NOTE — Progress Notes (Signed)
   Covid-19 Vaccination Clinic  Name:  CAYLEB JARNIGAN    MRN: [id number] DOB: 1935-12-26  08/05/2019  Mr. Puder was observed post Covid-19 immunization for 15 minutes without incidence. He was provided with Vaccine Information Sheet and instruction to access the V-Safe system.   Mr. Vangilder was instructed to call 911 with any severe reactions post vaccine: Marland Kitchen Difficulty breathing  . Swelling of your face and throat  . A fast heartbeat  . A bad rash all over your body  . Dizziness and weakness    Immunizations Administered    Name Date Dose VIS Date Route   Pfizer COVID-19 Vaccine 08/05/2019  9:45 AM 0.3 mL 06/11/2019 Intramuscular   Manufacturer: [employer], [employer]   Lot: [id number]   NDC: [address]-0

## 2019-08-30 ENCOUNTER — Ambulatory Visit: Payer: Medicare Other | Attending: Internal Medicine

## 2019-08-30 DIAGNOSIS — Z23 Encounter for immunization: Secondary | ICD-10-CM | POA: Insufficient documentation

## 2019-08-30 NOTE — Progress Notes (Signed)
   Covid-19 Vaccination Clinic  Name:  Justin Mccarty    MRN: [id number] DOB: July 29, 1935  08/30/2019  [name] was observed post Covid-19 immunization for 15 minutes without incidence. He was provided with Vaccine Information Sheet and instruction to access the V-Safe system.   [name] was instructed to call 911 with any severe reactions post vaccine: Marland Kitchen Difficulty breathing  . Swelling of your face and throat  . A fast heartbeat  . A bad rash all over your body  . Dizziness and weakness    Immunizations Administered    Name Date Dose VIS Date Route   Pfizer COVID-19 Vaccine 08/30/2019  2:26 PM 0.3 mL 06/11/2019 Intramuscular   Manufacturer: [employer], [employer]   Lot: [id number]   NDC: [address]-6

## 2019-11-18 ENCOUNTER — Ambulatory Visit
Admission: RE | Admit: 2019-11-18 | Discharge: 2019-11-18 | Disposition: A | Discharge status: Home / Self Care | Payer: Medicare Other | Source: Ambulatory Visit | Attending: Physician Assistant | Admitting: Physician Assistant

## 2019-11-18 ENCOUNTER — Other Ambulatory Visit: Payer: Self-pay | Admitting: Physician Assistant

## 2019-11-18 DIAGNOSIS — R062 Wheezing: Secondary | ICD-10-CM

## 2019-11-19 ENCOUNTER — Encounter (HOSPITAL_COMMUNITY): Payer: Self-pay | Admitting: Nurse Practitioner

## 2019-11-19 ENCOUNTER — Ambulatory Visit (HOSPITAL_COMMUNITY)
Admission: RE | Admit: 2019-11-19 | Discharge: 2019-11-19 | Disposition: A | Discharge status: Home / Self Care | Payer: Medicare Other | Source: Ambulatory Visit | Attending: Nurse Practitioner | Admitting: Nurse Practitioner

## 2019-11-19 ENCOUNTER — Other Ambulatory Visit: Payer: Self-pay

## 2019-11-19 VITALS — BP 124/56 | HR 73 | Ht 68.0 in | Wt 182.6 lb

## 2019-11-19 DIAGNOSIS — Z7951 Long term (current) use of inhaled steroids: Secondary | ICD-10-CM | POA: Diagnosis not present

## 2019-11-19 DIAGNOSIS — K219 Gastro-esophageal reflux disease without esophagitis: Secondary | ICD-10-CM | POA: Diagnosis not present

## 2019-11-19 DIAGNOSIS — Z8711 Personal history of peptic ulcer disease: Secondary | ICD-10-CM | POA: Insufficient documentation

## 2019-11-19 DIAGNOSIS — Z79899 Other long term (current) drug therapy: Secondary | ICD-10-CM | POA: Diagnosis not present

## 2019-11-19 DIAGNOSIS — Z791 Long term (current) use of non-steroidal anti-inflammatories (NSAID): Secondary | ICD-10-CM | POA: Insufficient documentation

## 2019-11-19 DIAGNOSIS — R001 Bradycardia, unspecified: Secondary | ICD-10-CM | POA: Diagnosis not present

## 2019-11-19 DIAGNOSIS — Z8719 Personal history of other diseases of the digestive system: Secondary | ICD-10-CM | POA: Insufficient documentation

## 2019-11-19 DIAGNOSIS — E7800 Pure hypercholesterolemia, unspecified: Secondary | ICD-10-CM | POA: Diagnosis not present

## 2019-11-19 DIAGNOSIS — I491 Atrial premature depolarization: Secondary | ICD-10-CM | POA: Insufficient documentation

## 2019-11-19 DIAGNOSIS — M170 Bilateral primary osteoarthritis of knee: Secondary | ICD-10-CM | POA: Diagnosis not present

## 2019-11-19 DIAGNOSIS — J45909 Unspecified asthma, uncomplicated: Secondary | ICD-10-CM | POA: Diagnosis not present

## 2019-11-19 NOTE — Progress Notes (Signed)
Primary Care Physician: Tamsen Roers, MD Referring Physician: Benjiman Core, PA    Justin Mccarty is a 84 y.o. male with a h/o of asthma, gastric ulcer, hyperplastic colon polyp, blood in stool, that is here for possible afib. He was visited by [place] for his annual check -up and an irregular heart beat was detected. He went to PCP the next day for further evaluation. The pt is asymptomatic. His EKG showed possible afib but on close evaluation, it is SR with PAC's similar to his EKG here today. The pt reports that he has been dx with asthma over the last 6 months and uses inhalers. He has some wheezing on ausculation today.he does not feel the skipped beats.  Today, he denies symptoms of palpitations, chest pain, shortness of breath, orthopnea, PND, lower extremity edema, dizziness, presyncope, syncope, or neurologic sequela. The patient is tolerating medications without difficulties and is otherwise without complaint today.   Past Medical History:  Diagnosis Date  . Acid reflux disease   . Acute blood loss anemia    Postoperative  . Cellulitis    History of Cellulitis  . History of sepsis   . Hypercholesterolemia    Mild  . Hyponatremia       . 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 Transfusion history    Status post transfusion without sequelae   Past Surgical History:  Procedure Laterality Date  . HERNIA REPAIR     Abdominal  . KNEE SURGERY    . TRANSURETHRAL RESECTION OF PROSTATE      Current Outpatient Medications  Medication Sig Dispense Refill  . albuterol (VENTOLIN HFA) 108 (90 Base) MCG/ACT inhaler Inhale 2 puffs into the lungs 2-3 times daily    . Cyanocobalamin (VITAMIN B 12 PO) Take 2 capsules by mouth in the morning and at bedtime.     . fluticasone (FLONASE) 50 MCG/ACT nasal spray Place 2 sprays into both nostrils daily.     Marland Kitchen glucosamine-chondroitin 500-400 MG tablet Take 1 tablet by mouth in the morning and at bedtime.    Marland Kitchen ibuprofen  (ADVIL,MOTRIN) 200 MG tablet Take 200 mg by mouth as needed.     . Influenza Vac High-Dose Quad (FLUZONE HIGH-DOSE QUADRIVALENT) 0.7 ML SUSY Fluzone High-Dose Quad 2020-21 (PF) 240 mcg/0.7 mL IM syringe  PHARMACY ADMINISTERED    . Multiple Vitamins-Minerals (ICAPS AREDS 2 PO) Take 1 capsule by mouth in the morning and at bedtime.    . Omega-3 Fatty Acids (FISH OIL PO) Take 1 capsule by mouth 2 (two) times daily.     Marland Kitchen VITAMIN D PO Take 2 tablets by mouth daily.     No current facility-administered medications for this encounter.    Allergies  Allergen Reactions  . Penicillin G Rash    Social History   Socioeconomic History  . Marital status: Married    Spouse name: Not on file  . Number of children: Not on file  . Years of education: Not on file  . Highest education level: Not on file  Occupational History  . Not on file  Tobacco Use  . Smoking status: Never Smoker  . Smokeless tobacco: Never Used  Substance and Sexual Activity  . Alcohol use: No  . Drug use: Not on file  . Sexual activity: Not on file  Other Topics Concern  . Not on file  Social History Narrative  . Not on file   Social Determinants of Health   Financial  Resource Strain:   . Difficulty of Paying Living Expenses:   Food Insecurity:   . Worried About Programme researcher, broadcasting/film/video in the Last Year:   . Barista in the Last Year:   Transportation Needs:   . Freight forwarder (Medical):   Marland Kitchen Lack of Transportation (Non-Medical):   Physical Activity:   . Days of Exercise per Week:   . Minutes of Exercise per Session:   Stress:   . Feeling of Stress :   Social Connections:   . Frequency of Communication with Friends and Family:   . Frequency of Social Gatherings with Friends and Family:   . Attends Religious Services:   . Active Member of Clubs or Organizations:   . Attends Banker Meetings:   Marland Kitchen Marital Status:   Intimate Partner Violence:   . Fear of Current or Ex-Partner:   .  Emotionally Abused:   Marland Kitchen Physically Abused:   . Sexually Abused:     Family History  Problem Relation Age of Onset  . Bone cancer Mother   . Colon cancer Sister     ROS- All systems are reviewed and negative except as per the HPI above  Physical Exam: Vitals:   11/19/19 0905  BP: (!) 124/56  Pulse: 73  Weight: 82.8 kg  Height: 5\' 8"  (1.727 m)   Wt Readings from Last 3 Encounters:  11/19/19 82.8 kg  06/02/16 86.2 kg  08/25/12 88.7 kg    Labs: Lab Results  Component Value Date   NA 132 (L) 09/24/2008   K 3.9 09/24/2008   CL 98 09/24/2008   CO2 28 09/24/2008   GLUCOSE 135 (H) 09/24/2008   BUN 11 09/24/2008   CREATININE 1.13 09/24/2008   CALCIUM 7.7 (L) 09/24/2008   Lab Results  Component Value Date   INR 1.3 09/24/2008   No results found for: CHOL, HDL, LDLCALC, TRIG   GEN- The patient is well appearing, alert and oriented x 3 today.   Head- normocephalic, atraumatic Eyes-  Sclera clear, conjunctiva pink Ears- hearing intact Oropharynx- clear Neck- supple, no JVP Lymph- no cervical lymphadenopathy Lungs-  Mild wheezing rt lung  Heart- Regular rate and rhythm, no murmurs, rubs or gallops, PMI not laterally displaced GI- soft, NT, ND, + BS Extremities- no clubbing, cyanosis, or edema MS- no significant deformity or atrophy Skin- no rash or lesion Psych- euthymic mood, full affect Neuro- strength and sensation are intact  EKG- NSR with PAC's, pr int 186 ms, qrs int 90 ms, qtdc 451 ms EKG reviewed form PCP office yesterday and shows SR with frequent PAC's    Assessment and Plan: 1. PAC's Asymptomatic  EKG is consistent with SR with premature atrial contractions Therefore no change in therapy is indicated at this time I will place a 2 week Zio patch to assess for afib since PAC's can be a precursor to afib but anticoagulation/rate control is not needed at this time   I will be back in touch with pt when monitor results are back   09/26/2008 C. Lup[facility] [address] [place], [place] [phone]

## 2019-12-09 ENCOUNTER — Ambulatory Visit: Payer: Medicare Other | Admitting: Pulmonary Disease

## 2019-12-09 ENCOUNTER — Other Ambulatory Visit: Payer: Self-pay

## 2019-12-09 ENCOUNTER — Encounter: Payer: Self-pay | Admitting: Pulmonary Disease

## 2019-12-09 VITALS — BP 126/76 | HR 86 | Temp 97.8°F | Ht 68.0 in | Wt 182.6 lb

## 2019-12-09 DIAGNOSIS — R0600 Dyspnea, unspecified: Secondary | ICD-10-CM | POA: Diagnosis not present

## 2019-12-09 DIAGNOSIS — R0609 Other forms of dyspnea: Secondary | ICD-10-CM

## 2019-12-09 NOTE — Progress Notes (Signed)
Justin Mccarty    [id number]    Aug 10, 1935  Primary Care Physician:Quinn, Bertha Stakes, PA  Referring Physician: Ephriam Jenkins, [place],  [place] [address]  Chief complaint:   Recent evaluation in the hospital for atrial fibrillation  HPI:  Recent treatment for bronchitis-symptoms lasted about 3 weeks of cough, sputum, shortness of breath Was told that he has asthma in the fall  Was prescribed albuterol-did not help much, states he actually feels better since his been off the inhaler  No chest pains or chest discomfort No chronic cough  Never smoker  Did tile work in the past  Wood-burning stove exposure with a lot of dust and fumes  No family history of lung disease  No significant shortness of breath foot mild to moderate activity  Outpatient Encounter Medications as of 12/09/2019  Medication Sig  . albuterol (VENTOLIN HFA) 108 (90 Base) MCG/ACT inhaler Inhale 2 puffs into the lungs 2-3 times daily  . Cyanocobalamin (VITAMIN B 12 PO) Take 2 capsules by mouth in the morning and at bedtime.   . fluticasone (FLONASE) 50 MCG/ACT nasal spray Place 2 sprays into both nostrils daily.   Marland Kitchen glucosamine-chondroitin 500-400 MG tablet Take 1 tablet by mouth in the morning and at bedtime.  Marland Kitchen ibuprofen (ADVIL,MOTRIN) 200 MG tablet Take 200 mg by mouth as needed.   . Influenza Vac High-Dose Quad (FLUZONE HIGH-DOSE QUADRIVALENT) 0.7 ML SUSY Fluzone High-Dose Quad 2020-21 (PF) 240 mcg/0.7 mL IM syringe  PHARMACY ADMINISTERED  . Multiple Vitamins-Minerals (ICAPS AREDS 2 PO) Take 1 capsule by mouth in the morning and at bedtime.  . Omega-3 Fatty Acids (FISH OIL PO) Take 1 capsule by mouth 2 (two) times daily.   Marland Kitchen VITAMIN D PO Take 2 tablets by mouth daily.   No facility-administered encounter medications on file as of 12/09/2019.    Allergies as of 12/09/2019 - Review Complete 11/19/2019  Allergen Reaction Noted  . Penicillin g Rash 11/19/2019    Past  Medical History:  Diagnosis Date  . Acid reflux disease   . Acute blood loss anemia    Postoperative  . Cellulitis    History of Cellulitis  . History of sepsis   . Hypercholesterolemia    Mild  . Hyponatremia       . 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 Transfusion history    Status post transfusion without sequelae    Past Surgical History:  Procedure Laterality Date  . HERNIA REPAIR     Abdominal  . KNEE SURGERY    . TRANSURETHRAL RESECTION OF PROSTATE      Family History  Problem Relation Age of Onset  . Bone cancer Mother   . Colon cancer Sister     Social History   Socioeconomic History  . Marital status: Married    Spouse name: Not on file  . Number of children: Not on file  . Years of education: Not on file  . Highest education level: Not on file  Occupational History  . Not on file  Tobacco Use  . Smoking status: Never Smoker  . Smokeless tobacco: Never Used  Substance and Sexual Activity  . Alcohol use: No  . Drug use: Not on file  . Sexual activity: Not on file  Other Topics Concern  . Not on file  Social History Narrative  . Not on file   Social Determinants of Health  Financial Resource Strain:   . Difficulty of Paying Living Expenses:   Food Insecurity:   . Worried About Programme researcher, broadcasting/film/video in the Last Year:   . Barista in the Last Year:   Transportation Needs:   . Freight forwarder (Medical):   Marland Kitchen Lack of Transportation (Non-Medical):   Physical Activity:   . Days of Exercise per Week:   . Minutes of Exercise per Session:   Stress:   . Feeling of Stress :   Social Connections:   . Frequency of Communication with Friends and Family:   . Frequency of Social Gatherings with Friends and Family:   . Attends Religious Services:   . Active Member of Clubs or Organizations:   . Attends Banker Meetings:   Marland Kitchen Marital Status:   Intimate Partner Violence:   . Fear of Current or Ex-Partner:   . Emotionally Abused:    Marland Kitchen Physically Abused:   . Sexually Abused:     Review of Systems  Constitutional: Negative for fever and unexpected weight change.  Respiratory: Negative.   Cardiovascular: Negative.   Gastrointestinal: Negative.     There were no vitals filed for this visit.   Physical Exam Constitutional:      Appearance: Normal appearance.  HENT:     Head: Normocephalic.     Nose: Nose normal.  Eyes:     General:        Right eye: No discharge.        Left eye: No discharge.     Pupils: Pupils are equal, round, and reactive to light.  Cardiovascular:     Rate and Rhythm: Normal rate and regular rhythm.     Heart sounds: No murmur heard.  No friction rub. No gallop.   Pulmonary:     Effort: Pulmonary effort is normal. No respiratory distress.     Breath sounds: Normal breath sounds. No stridor. No wheezing or rhonchi.  Musculoskeletal:        General: No swelling.     Cervical back: No rigidity or tenderness.  Lymphadenopathy:     Cervical: No cervical adenopathy.  Skin:    General: Skin is warm.  Neurological:     General: No focal deficit present.     Mental Status: He is alert.  Psychiatric:        Mood and Affect: Mood normal.    Data Reviewed: Chest x-ray from 11 years ago showed no infiltrative process Chest x-ray on 11/18/2019 shows mild atelectasis, emphysematous changes Increased markings in right upper lobe, left lung base-reviewed by myself  Assessment:  Recent bronchitis  History of emphysema  Abnormal chest x-ray findings  Plan/Recommendations: Obtain pulmonary function test to assess for obstructive lung disease  Obtain CT scan of the chest to further evaluate infiltrative process, degree of emphysematous changes  Patient states he feels much better being off the inhalers at the present time, will not start him on any inhalers at present  We will follow-up with him in about 4 to 6 weeks to follow-up on CT and PFT   Virl Diamond MD [facility] 12/09/2019, 9:09 AM  CC: Adrienne Mocha, PA

## 2019-12-09 NOTE — Patient Instructions (Signed)
History of emphysema Recent bronchitic exacerbation  We will obtain a pulmonary function test CT scan of the chest-been done for worsening chest x-ray findings  I will see you in 4 to 6 weeks-appointment to be made after you have had your CT and breathing study to look over the studies  Call with significant concerns

## 2019-12-29 NOTE — Addendum Note (Signed)
Encounter addended by: Shona Simpson, RN on: 12/29/2019 9:02 AM  Actions taken: Imaging Exam ended

## 2020-01-20 ENCOUNTER — Ambulatory Visit (INDEPENDENT_AMBULATORY_CARE_PROVIDER_SITE_OTHER): Payer: Medicare Other | Admitting: Pulmonary Disease

## 2020-01-20 ENCOUNTER — Other Ambulatory Visit: Payer: Self-pay

## 2020-01-20 ENCOUNTER — Ambulatory Visit (INDEPENDENT_AMBULATORY_CARE_PROVIDER_SITE_OTHER)
Admission: RE | Admit: 2020-01-20 | Discharge: 2020-01-20 | Disposition: A | Discharge status: Home / Self Care | Payer: Medicare Other | Source: Ambulatory Visit | Attending: Pulmonary Disease | Admitting: Pulmonary Disease

## 2020-01-20 DIAGNOSIS — R0609 Other forms of dyspnea: Secondary | ICD-10-CM

## 2020-01-20 DIAGNOSIS — R0600 Dyspnea, unspecified: Secondary | ICD-10-CM

## 2020-01-20 LAB — PULMONARY FUNCTION TEST
DL/VA % pred: 110 %
DL/VA: 4.32 ml/min/mmHg/L
DLCO cor % pred: 72 %
DLCO cor: 15.82 ml/min/mmHg
DLCO unc % pred: 72 %
DLCO unc: 15.82 ml/min/mmHg
FEF 25-75 Post: 1 L/sec
FEF 25-75 Pre: 0.86 L/sec
FEF2575-%Change-Post: 16 %
FEF2575-%Pred-Post: 62 %
FEF2575-%Pred-Pre: 53 %
FEV1-%Change-Post: 5 %
FEV1-%Pred-Post: 68 %
FEV1-%Pred-Pre: 64 %
FEV1-Post: 1.65 L
FEV1-Pre: 1.56 L
FEV1FVC-%Change-Post: 6 %
FEV1FVC-%Pred-Pre: 88 %
FEV6-%Change-Post: 0 %
FEV6-%Pred-Post: 76 %
FEV6-%Pred-Pre: 76 %
FEV6-Post: 2.44 L
FEV6-Pre: 2.45 L
FEV6FVC-%Change-Post: 0 %
FEV6FVC-%Pred-Post: 108 %
FEV6FVC-%Pred-Pre: 107 %
FVC-%Change-Post: 0 %
FVC-%Pred-Post: 70 %
FVC-%Pred-Pre: 71 %
FVC-Post: 2.44 L
FVC-Pre: 2.46 L
Post FEV1/FVC ratio: 68 %
Post FEV6/FVC ratio: 100 %
Pre FEV1/FVC ratio: 63 %
Pre FEV6/FVC Ratio: 100 %
RV % pred: 72 %
RV: 1.85 L
TLC % pred: 65 %
TLC: 4.24 L

## 2020-01-20 NOTE — Progress Notes (Signed)
Full PFT performed today. °

## 2020-01-25 ENCOUNTER — Encounter: Payer: Self-pay | Admitting: Pulmonary Disease

## 2020-01-25 ENCOUNTER — Other Ambulatory Visit: Payer: Self-pay

## 2020-01-25 ENCOUNTER — Ambulatory Visit: Payer: Medicare Other | Admitting: Pulmonary Disease

## 2020-01-25 VITALS — BP 132/74 | HR 75 | Temp 98.1°F | Ht 67.0 in | Wt 182.0 lb

## 2020-01-25 DIAGNOSIS — R942 Abnormal results of pulmonary function studies: Secondary | ICD-10-CM

## 2020-01-25 LAB — SEDIMENTATION RATE: Sed Rate: 16 mm/hr (ref 0–20)

## 2020-01-25 NOTE — Patient Instructions (Signed)
Scarring in the lung related to work exposure in the past  Shortness of breath -Inhalers have not helped in the past  We will get some blood work to look for inflammation in the rest of the body  We will repeat your CT scan in 3 months Repeat the breathing study in 3 months  Call with significant concerns  Follow-up after the studies above

## 2020-01-25 NOTE — Progress Notes (Signed)
Justin Mccarty    [id number]    1935/07/14  Primary Care Physician:Quinn, America Brown, PA  Referring Physician: Adrienne Mocha, PA [address]  [place],  [place] [address]  Chief complaint:   Recent evaluation in the hospital for atrial fibrillation He is following up currently for shortness of breath  HPI:  Recent treatment for bronchitis-symptoms lasted about 3 weeks of cough, sputum, shortness of breath He was told about a recent diagnosis of asthma  He is not limited with activities but does get short of breath with some activities  no chest pains or chest discomfort No chronic cough  Never smoker  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    glucosamine-chondroitin 500-400 MG tablet Take 1 tablet by mouth in the morning and at bedtime.   Multiple Vitamins-Minerals (ICAPS AREDS 2 PO) Take 1 capsule by mouth in the morning and at bedtime.   VITAMIN D PO Take 2 tablets by mouth daily.   [DISCONTINUED] Influenza Vac High-Dose Quad (FLUZONE HIGH-DOSE QUADRIVALENT) 0.7 ML SUSY Fluzone High-Dose Quad 2020-21 (PF) 240 mcg/0.7 mL IM syringe  PHARMACY ADMINISTERED   albuterol (VENTOLIN HFA) 108 (90 Base) MCG/ACT inhaler Inhale 2 puffs into the lungs 2-3 times daily (Patient not taking: Reported on 01/25/2020)   fluticasone (FLONASE) 50 MCG/ACT nasal spray Place 2 sprays into both nostrils daily.  (Patient not taking: Reported on 01/25/2020)   ibuprofen (ADVIL,MOTRIN) 200 MG tablet Take 200 mg by mouth as needed.  (Patient not taking: Reported on 01/25/2020)   Omega-3 Fatty Acids (FISH OIL PO) Take 1 capsule by  mouth 2 (two) times daily.  (Patient not taking: Reported on 01/25/2020)   No facility-administered encounter medications on file as of 01/25/2020.    Allergies as of 01/25/2020 - Review Complete 01/25/2020  Allergen Reaction Noted   Penicillin g Rash 11/19/2019    Past Medical History:  Diagnosis Date   Acid reflux disease    Acute blood loss anemia    Postoperative   Cellulitis    History of Cellulitis   History of sepsis    Hypercholesterolemia    Mild   Hyponatremia        Osteoarthritis    In the knees   Transfusion history    Status post transfusion without sequelae    Past Surgical History:  Procedure Laterality Date   HERNIA REPAIR     Abdominal   KNEE SURGERY     TRANSURETHRAL RESECTION OF PROSTATE      Family History  Problem Relation Age of Onset   Bone cancer Mother    Colon cancer Sister     Social History   Socioeconomic History   Marital status: Married    Spouse name: Not on file   Number of children: Not on file   Years of education: Not on file   Highest education level: Not on file  Occupational History   Not on file  Tobacco Use   Smoking status: Never Smoker   Smokeless tobacco: Never Used  Substance and Sexual Activity   Alcohol  use: No   Drug use: Not on file   Sexual activity: Not on file  Other Topics Concern   Not on file  Social History Narrative   Not on file   Social Determinants of Health   Financial Resource Strain:    Difficulty of Paying Living Expenses:   Food Insecurity:    Worried About Running Out of Food in the Last Year:    Barista in the Last Year:   Transportation Needs:    Freight forwarder (Medical):    Lack of Transportation (Non-Medical):   Physical Activity:    Days of Exercise per Week:    Minutes of Exercise per Session:   Stress:    Feeling of Stress :   Social Connections:    Frequency of Communication with Friends and Family:    Frequency  of Social Gatherings with Friends and Family:    Attends Religious Services:    Active Member of Clubs or Organizations:    Attends Engineer, structural:    Marital Status:   Intimate Partner Violence:    Fear of Current or Ex-Partner:    Emotionally Abused:    Physically Abused:    Sexually Abused:     Review of Systems  Constitutional: Negative for fever and unexpected weight change.  Respiratory: Negative.   Cardiovascular: Negative.   Gastrointestinal: Negative.     Vitals:   01/25/20 0904  BP: (!) 132/74  Pulse: 75  Temp: 98.1 F (36.7 C)  SpO2: 98%     Physical Exam Constitutional:      Appearance: Normal appearance.  HENT:     Head: Normocephalic.     Nose: Nose normal.  Eyes:     General:        Right eye: No discharge.        Left eye: No discharge.     Pupils: Pupils are equal, round, and reactive to light.  Cardiovascular:     Rate and Rhythm: Normal rate and regular rhythm.     Heart sounds: No murmur heard.  No friction rub. No gallop.   Pulmonary:     Effort: Pulmonary effort is normal. No respiratory distress.     Breath sounds: Normal breath sounds. No stridor. No wheezing or rhonchi.  Musculoskeletal:        General: No swelling.     Cervical back: No rigidity or tenderness.  Lymphadenopathy:     Cervical: No cervical adenopathy.  Neurological:     Mental Status: He is alert.    Data Reviewed: Chest x-ray from 11 years ago showed no infiltrative process Chest x-ray on 11/18/2019 shows mild atelectasis, emphysematous changes Increased markings in right upper lobe, left lung base-reviewed by myself CT scan reviewed showing some areas of fibrosis  PFT reveals moderate obstruction and moderate restrictive disease  Assessment:  Recent bronchitis  History of emphysema  Abnormal chest x-ray findings  CT with evidence of pulmonary fibrosis-may be related to occupational exposure, possibility of hypersensitivity.  Nodular  change on the CT also needs followed up radiologically  Plan/Recommendations: Repeat CT scan in about 3 months  Repeat PFT in about 3 months as well  He does not want to continue using inhalers as he felt he was doing better without any inhalers, the inhalers was actually making him feel poorer  We will follow-up with him in about 3 months  Side effect profile of using steroids discussed  Bronchoscopy discussed as a means of  taking biopsies in the lungs to further evaluate will be going on in his lungs  We will obtain hypersensitivity panel panel Vasculitic panel  He feels relatively well at the present time, continue to monitor symptoms closely  Virl Diamond MD [facility] 01/25/2020, 9:10 AM  CC: Adrienne Mocha, PA

## 2020-01-26 LAB — ANA: Anti Nuclear Antibody (ANA): NEGATIVE

## 2020-01-26 LAB — RHEUMATOID FACTOR: Rheumatoid fact SerPl-aCnc: <14 IU/mL (ref <14)

## 2020-01-28 LAB — HYPERSENSITIVITY PNEUMONITIS
A. Pullulans Abs: NEGATIVE
A.Fumigatus #1 Abs: NEGATIVE
Micropolyspora faeni, IgG: NEGATIVE
Pigeon Serum Abs: NEGATIVE
Thermoact. Saccharii: NEGATIVE
Thermoactinomyces vulgaris, IgG: NEGATIVE

## 2020-04-04 ENCOUNTER — Other Ambulatory Visit: Payer: Self-pay

## 2020-04-04 ENCOUNTER — Ambulatory Visit (INDEPENDENT_AMBULATORY_CARE_PROVIDER_SITE_OTHER)
Admission: RE | Admit: 2020-04-04 | Discharge: 2020-04-04 | Disposition: A | Discharge status: Home / Self Care | Payer: Medicare Other | Source: Ambulatory Visit | Attending: Pulmonary Disease | Admitting: Pulmonary Disease

## 2020-04-04 DIAGNOSIS — R942 Abnormal results of pulmonary function studies: Secondary | ICD-10-CM | POA: Diagnosis not present

## 2020-04-17 ENCOUNTER — Ambulatory Visit: Payer: Medicare Other | Admitting: Pulmonary Disease

## 2020-04-20 ENCOUNTER — Other Ambulatory Visit: Payer: Self-pay

## 2020-04-20 ENCOUNTER — Ambulatory Visit: Payer: Medicare Other | Admitting: Pulmonary Disease

## 2020-04-20 ENCOUNTER — Ambulatory Visit (INDEPENDENT_AMBULATORY_CARE_PROVIDER_SITE_OTHER): Payer: Medicare Other | Admitting: Pulmonary Disease

## 2020-04-20 ENCOUNTER — Encounter: Payer: Self-pay | Admitting: Pulmonary Disease

## 2020-04-20 VITALS — BP 120/60 | HR 84 | Temp 97.6°F | Ht 67.0 in | Wt 181.0 lb

## 2020-04-20 DIAGNOSIS — R059 Cough, unspecified: Secondary | ICD-10-CM

## 2020-04-20 DIAGNOSIS — R942 Abnormal results of pulmonary function studies: Secondary | ICD-10-CM | POA: Diagnosis not present

## 2020-04-20 DIAGNOSIS — R0602 Shortness of breath: Secondary | ICD-10-CM

## 2020-04-20 DIAGNOSIS — R9389 Abnormal findings on diagnostic imaging of other specified body structures: Secondary | ICD-10-CM | POA: Diagnosis not present

## 2020-04-20 DIAGNOSIS — R911 Solitary pulmonary nodule: Secondary | ICD-10-CM

## 2020-04-20 LAB — PULMONARY FUNCTION TEST
DL/VA % pred: 102 %
DL/VA: 4.01 ml/min/mmHg/L
DLCO cor % pred: 69 %
DLCO cor: 15.16 ml/min/mmHg
DLCO unc % pred: 69 %
DLCO unc: 15.16 ml/min/mmHg
FEF 25-75 Post: 0.86 L/sec
FEF 25-75 Pre: 0.79 L/sec
FEF2575-%Change-Post: 8 %
FEF2575-%Pred-Post: 53 %
FEF2575-%Pred-Pre: 49 %
FEV1-%Change-Post: 2 %
FEV1-%Pred-Post: 59 %
FEV1-%Pred-Pre: 57 %
FEV1-Post: 1.43 L
FEV1-Pre: 1.4 L
FEV1FVC-%Change-Post: 2 %
FEV1FVC-%Pred-Pre: 85 %
FEV6-%Change-Post: 0 %
FEV6-%Pred-Post: 72 %
FEV6-%Pred-Pre: 71 %
FEV6-Post: 2.31 L
FEV6-Pre: 2.29 L
FEV6FVC-%Change-Post: 0 %
FEV6FVC-%Pred-Post: 108 %
FEV6FVC-%Pred-Pre: 107 %
FVC-%Change-Post: 0 %
FVC-%Pred-Post: 66 %
FVC-%Pred-Pre: 66 %
FVC-Post: 2.31 L
FVC-Pre: 2.31 L
Post FEV1/FVC ratio: 62 %
Post FEV6/FVC ratio: 100 %
Pre FEV1/FVC ratio: 61 %
Pre FEV6/FVC Ratio: 99 %
RV % pred: 107 %
RV: 2.72 L
TLC % pred: 80 %
TLC: 5.2 L

## 2020-04-20 MED ORDER — BREZTRI AEROSPHERE 160-9-4.8 MCG/ACT IN AERO: 2.0000 | INHALATION_SPRAY | Freq: Two times a day (BID) | RESPIRATORY_TRACT | 1 refills | Status: DC

## 2020-04-20 MED ORDER — BREZTRI AEROSPHERE 160-9-4.8 MCG/ACT IN AERO: 1.0000 | INHALATION_SPRAY | Freq: Two times a day (BID) | RESPIRATORY_TRACT | 0 refills | Status: DC

## 2020-04-20 MED ORDER — HYDROCODONE-HOMATROPINE 5-1.5 MG/5ML PO SYRP: 5.0000 mL | ORAL_SOLUTION | Freq: Four times a day (QID) | ORAL | 0 refills | Status: DC | PRN

## 2020-04-20 NOTE — Addendum Note (Signed)
Addended by: Marcellus Scott on: 04/20/2020 03:59 PM   Modules accepted: Orders

## 2020-04-20 NOTE — Patient Instructions (Addendum)
I will follow-up with you in 3 months  We will repeat the CT scan of the chest in 6 months Repeat breathing study in 6 months  Cough medicine -Hycodan will be sent to pharmacy  Trial with breztri -Inhaler to be used twice a day  We can consider bronchoscopy -We will take a look in the lungs to see if there is any infection by taking samples from the lungs  Call with significant symptoms

## 2020-04-20 NOTE — Progress Notes (Signed)
Justin Mccarty    [id number]    1936/04/15  Primary Care Physician:Quinn, Bertha Stakes, PA  Referring Physician: Ephriam Jenkins, [address] [place],  [place] [address]  Chief complaint:   Recent evaluation in the hospital for atrial fibrillation He remains short of breath  HPI:  Symptoms are unchanged since last time he was here CT scan previously had shown an interstitial process, fibrotic process -He had stated that inhalers were not helping in the past so has not been using any -He does have a protracted cough usually worse when he is trying to go to sleep -He denies having any symptoms of reflux -He does get short of breath with activities  -Denies chest pain or chest discomfort -Never smoker  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 Cyanocobalamin (VITAMIN B 12 PO) Take 2 capsules by mouth in the morning and at bedtime.   Marland Kitchen glucosamine-chondroitin 500-400 MG tablet Take 1 tablet by mouth in the morning and at bedtime.  . Multiple Vitamins-Minerals (ICAPS AREDS 2 PO) Take 1 capsule by mouth in the morning and at bedtime.  . Omega-3 Fatty Acids (FISH OIL PO) Take 1 capsule by mouth 2 (two) times daily.   Marland Kitchen VITAMIN D PO Take 2 tablets by mouth daily.  . [DISCONTINUED] albuterol (VENTOLIN HFA) 108 (90 Base) MCG/ACT inhaler Inhale 2 puffs into the lungs 2-3 times daily (Patient not taking: Reported on 01/25/2020)  . [DISCONTINUED] fluticasone (FLONASE) 50 MCG/ACT nasal spray Place 2 sprays into both nostrils daily.  (Patient not taking: Reported on 01/25/2020)  . [DISCONTINUED] ibuprofen (ADVIL,MOTRIN) 200 MG tablet Take 200 mg by mouth as needed.  (Patient not taking: Reported on 01/25/2020)    No facility-administered encounter medications on file as of 04/20/2020.    Allergies as of 04/20/2020 - Review Complete 04/20/2020  Allergen Reaction Noted  . Penicillin g Rash 11/19/2019    Past Medical History:  Diagnosis Date  . Acid reflux disease   . Acute blood loss anemia    Postoperative  . Cellulitis    History of Cellulitis  . History of sepsis   . Hypercholesterolemia    Mild  . Hyponatremia       . 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 Transfusion history    Status post transfusion without sequelae    Past Surgical History:  Procedure Laterality Date  . HERNIA REPAIR     Abdominal  . KNEE SURGERY    . TRANSURETHRAL RESECTION OF PROSTATE      Family History  Problem Relation Age of Onset  . Bone cancer Mother   . Colon cancer Sister     Social History   Socioeconomic History  . Marital status: Married    Spouse name: Not on file  . Number of children: Not on file  . Years of education: Not on file  . Highest education level: Not on file  Occupational History  . Not on file  Tobacco Use  . Smoking status: Never Smoker  . Smokeless tobacco: Never Used  Substance and Sexual Activity  . Alcohol use: No  . Drug use: Not on  file  . Sexual activity: Not on file  Other Topics Concern  . Not on file  Social History Narrative  . Not on file   Social Determinants of Health   Financial Resource Strain:   . Difficulty of Paying Living Expenses: Not on file  Food Insecurity:   . Worried About Charity fundraiser in the Last Year: Not on file  . Ran Out of Food in the Last Year: Not on file  Transportation Needs:   . Lack of Transportation (Medical): Not on file  . Lack of Transportation (Non-Medical): Not on file  Physical Activity:   . Days of Exercise per Week: Not on file  . Minutes of Exercise per Session: Not on file  Stress:   . Feeling of Stress : Not on file  Social Connections:   . Frequency of Communication with Friends and Family:  Not on file  . Frequency of Social Gatherings with Friends and Family: Not on file  . Attends Religious Services: Not on file  . Active Member of Clubs or Organizations: Not on file  . Attends Archivist Meetings: Not on file  . Marital Status: Not on file  Intimate Partner Violence:   . Fear of Current or Ex-Partner: Not on file  . Emotionally Abused: Not on file  . Physically Abused: Not on file  . Sexually Abused: Not on file    Review of Systems  Constitutional: Negative for fever and unexpected weight change.  Respiratory: Positive for cough and shortness of breath.   Cardiovascular: Negative.   Gastrointestinal: Negative.     Vitals:   04/20/20 1504  BP: 120/60  Pulse: 84  Temp: 97.6 F (36.4 C)  SpO2: 94%     Physical Exam Constitutional:      Appearance: Normal appearance.  HENT:     Head: Normocephalic.     Nose: Nose normal.  Eyes:     General:        Right eye: No discharge.        Left eye: No discharge.     Pupils: Pupils are equal, round, and reactive to light.  Cardiovascular:     Rate and Rhythm: Normal rate and regular rhythm.     Heart sounds: No murmur heard.  No friction rub. No gallop.   Pulmonary:     Effort: Pulmonary effort is normal. No respiratory distress.     Breath sounds: No stridor. Rales present. No wheezing or rhonchi.  Musculoskeletal:        General: No swelling.     Cervical back: No rigidity or tenderness.  Lymphadenopathy:     Cervical: No cervical adenopathy.  Neurological:     Mental Status: He is alert.    Data Reviewed: Chest x-ray from 11 years ago showed no infiltrative process Chest x-ray on 11/18/2019 shows mild atelectasis, emphysematous changes Increased markings in right upper lobe, left lung base-reviewed by myself CT scan reviewed showing some areas of fibrosis  Repeat CT scan reviewed with the patient showing stable findings, nodule in the left upper lobe looks more prominent needs followed  up  PFT reveals moderate obstruction and moderate restrictive disease-not significantly changed from one performed about 3 months ago  Hypersensitivity panel negative, ANA negative, rheumatoid factor negative, ESR within normal limits  Assessment:  Recent bronchitis -Symptoms are persisting -Shortness of breath  Cough  History of emphysema  Abnormal CT scan of the chest showing a lung nodule and evidence of fibrosis -Unchanged  from recent -The lung nodule is a bit more prominent but needs further follow-up  History of emphysema  Abnormal chest x-ray findings  Evidence of fibrosis on CT  Plan/Recommendations: We will repeat CT in 6 months  Pulmonary function testing 6 months  Trial with breztri  Hycodan for cough  Follow-up in 3 months  Bronchoscopy as a means of further investigation was discussed  Call with significant concerns  Sherrilyn Rist MD [facility] 04/20/2020, 3:08 PM  CC: Ephriam Jenkins, PA

## 2020-04-20 NOTE — Progress Notes (Signed)
Full PFT performed today. °

## 2020-06-05 ENCOUNTER — Encounter: Payer: Self-pay | Admitting: Pulmonary Disease

## 2020-06-05 ENCOUNTER — Telehealth: Payer: Self-pay | Admitting: Pulmonary Disease

## 2020-06-05 NOTE — Telephone Encounter (Signed)
06/05/20   Was able to reach patient's daughter via telephone.  Also spoke with patient and spouse.  Patient has had more anxiety since last office visit in October/2021.  He is having some cough.  Patient's daughter feels that this is likely allergies.  Patient does occasionally have blood streaks in that mucus.  Patient also struggling with some anxiety regarding the current work-up and feeling that there is "nothing else that can be done".  Patient has a family friend that recommended the patient could also consider seeing Dr. Everardo All.  They would like to set up this appointment as a second opinion to further review and establish care with Dr. Everardo All.  Plan: We will get patient scheduled to see Dr. Val Eagle as he is familiar with the patient and currently established with the patient tomorrow on 06/06/2020 at 1:30 PM.  Encourage patient to arrive 15 minutes early to help with check-in on arrival times  We will also route this message to Dr. Val Eagle as well as Dr. Everardo All as Lorain Childes to see if they are both agreeable with having the patient switch to upcoming follow-ups with Dr. Everardo All.  Reviewed that if symptoms should worsen patient should seek emergent care evaluation.  Will leave in triage to await response from Dr. Val Eagle and Dr. Everardo All about having patient switch to establish with Dr. Everardo All.  If they are agreeable to this which then patient can be scheduled for a 30-minute office visit to establish care with Dr. Everardo All.

## 2020-06-05 NOTE — Telephone Encounter (Signed)
I am okay with the patient seeing Dr. Everardo All

## 2020-06-05 NOTE — Telephone Encounter (Signed)
Will route to Lauren for follow-up.  Discussed with Dr. Everardo All.  Patient needs 30-minute time slot to establish care with Dr. Everardo All.  Can we please contact the family to get this scheduled.  Elisha Headland, FNP

## 2020-06-05 NOTE — Telephone Encounter (Signed)
I am okay with the patient seeing Dr. Ellison 

## 2020-06-06 ENCOUNTER — Other Ambulatory Visit: Payer: Self-pay

## 2020-06-06 ENCOUNTER — Encounter: Payer: Self-pay | Admitting: Pulmonary Disease

## 2020-06-06 ENCOUNTER — Ambulatory Visit: Payer: Medicare Other | Admitting: Pulmonary Disease

## 2020-06-06 VITALS — BP 132/74 | HR 107 | Temp 98.0°F | Ht 67.0 in | Wt 184.8 lb

## 2020-06-06 DIAGNOSIS — R0602 Shortness of breath: Secondary | ICD-10-CM | POA: Diagnosis not present

## 2020-06-06 MED ORDER — ALBUTEROL SULFATE (2.5 MG/3ML) 0.083% IN NEBU
2.5000 mg | INHALATION_SOLUTION | Freq: Four times a day (QID) | RESPIRATORY_TRACT | 1 refills | Status: DC | PRN
Start: 2020-06-06 — End: 2020-07-03

## 2020-06-06 MED ORDER — BUSPIRONE HCL 5 MG PO TABS: 5.0000 mg | ORAL_TABLET | Freq: Three times a day (TID) | ORAL | 1 refills | Status: AC

## 2020-06-06 MED ORDER — HYDROCODONE-HOMATROPINE 5-1.5 MG/5ML PO SYRP: 5.0000 mL | ORAL_SOLUTION | Freq: Four times a day (QID) | ORAL | 0 refills | Status: DC | PRN

## 2020-06-06 MED ORDER — SODIUM CHLORIDE 3 % IN NEBU: INHALATION_SOLUTION | Freq: Two times a day (BID) | RESPIRATORY_TRACT | 1 refills | Status: DC | PRN

## 2020-06-06 NOTE — Patient Instructions (Addendum)
I will give you a prescription for Hycodan to be used for his cough  Nebulizer machine  Albuterol for nebulization to be used 4 times daily as needed  Hypertonic saline nebulization twice a day to help secretions  Continue with your inhaler-Breztri   We did talk about anxiety -Prescription for BuSpar- will be starting at a very low dose to be used 3 times a day  Sputum for Gram stain and cultures Sputum for fungal stain and cultures  We will try and make an appointment for you to see Dr. Everardo All for an opinion  We do need to repeat a CT in a few months to follow-up on the nodule in the left upper lung

## 2020-06-06 NOTE — Progress Notes (Signed)
Justin Mccarty    [id number]    21-May-1936  Primary Care Physician:Quinn, Bertha Stakes, PA  Referring Physician: Ephriam Jenkins, [place],  [place] [address]  Chief complaint:   He remains short of breath Still with a significant cough  HPI:  Symptoms are unchanged since last time he was here Still coughing a lot Was not able to get Hycodan  Has been on Mucinex DM which is not really helping  Spouse thinks he has anxiety playing a role in how he feels Has not been doing well generally, appetite is down   CT scan previously had shown an interstitial process, fibrotic process -He had stated that inhalers were not helping in the past so has not been using any -He does have a protracted cough usually worse when he is trying to go to sleep -He denies having any symptoms of reflux -He does get short of breath with activities  Repeat CT shows persistent process with nodular change in the left upper lobe Hypersensitivity panel negative, ANA negative, sed rate within normal limit PFT with moderate restriction   -Denies chest pain or chest discomfort -Never smoker  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 Budeson-Glycopyrrol-Formoterol (BREZTRI AEROSPHERE) 160-9-4.8 MCG/ACT AERO Inhale 2 puffs into the lungs in the morning and at bedtime.  . Cyanocobalamin (VITAMIN B 12 PO) Take 2 capsules by mouth in the morning and at bedtime.   Marland Kitchen glucosamine-chondroitin 500-400 MG tablet Take 1 tablet by mouth in the morning and at bedtime.  Marland Kitchen HYDROcodone-homatropine (HYCODAN) 5-1.5 MG/5ML syrup Take 5 mLs by mouth every 6 (six) hours as needed for cough.  . Multiple Vitamins-Minerals (ICAPS AREDS 2  PO) Take 1 capsule by mouth in the morning and at bedtime.  . Omega-3 Fatty Acids (FISH OIL PO) Take 1 capsule by mouth 2 (two) times daily.   Marland Kitchen VITAMIN D PO Take 2 tablets by mouth daily.  . Budeson-Glycopyrrol-Formoterol (BREZTRI AEROSPHERE) 160-9-4.8 MCG/ACT AERO Inhale 1 puff into the lungs in the morning and at bedtime.   No facility-administered encounter medications on file as of 06/06/2020.    Allergies as of 06/06/2020 - Review Complete 06/06/2020  Allergen Reaction Noted  . Penicillin g Rash 11/19/2019    Past Medical History:  Diagnosis Date  . Acid reflux disease   . Acute blood loss anemia    Postoperative  . Cellulitis    History of Cellulitis  . History of sepsis   . Hypercholesterolemia    Mild  . Hyponatremia       . 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 Transfusion history    Status post transfusion without sequelae    Past Surgical History:  Procedure Laterality Date  . HERNIA REPAIR     Abdominal  . KNEE SURGERY    . TRANSURETHRAL RESECTION OF PROSTATE      Family History  Problem Relation Age of Onset  . Bone cancer Mother   . Colon cancer Sister     Social History   Socioeconomic History  . Marital status: Married    Spouse name: Not on file  . Number  of children: Not on file  . Years of education: Not on file  . Highest education level: Not on file  Occupational History  . Not on file  Tobacco Use  . Smoking status: Never Smoker  . Smokeless tobacco: Never Used  Substance and Sexual Activity  . Alcohol use: No  . Drug use: Not on file  . Sexual activity: Not on file  Other Topics Concern  . Not on file  Social History Narrative  . Not on file   Social Determinants of Health   Financial Resource Strain:   . Difficulty of Paying Living Expenses: Not on file  Food Insecurity:   . Worried About Charity fundraiser in the Last Year: Not on file  . Ran Out of Food in the Last Year: Not on file  Transportation Needs:   . Lack of  Transportation (Medical): Not on file  . Lack of Transportation (Non-Medical): Not on file  Physical Activity:   . Days of Exercise per Week: Not on file  . Minutes of Exercise per Session: Not on file  Stress:   . Feeling of Stress : Not on file  Social Connections:   . Frequency of Communication with Friends and Family: Not on file  . Frequency of Social Gatherings with Friends and Family: Not on file  . Attends Religious Services: Not on file  . Active Member of Clubs or Organizations: Not on file  . Attends Archivist Meetings: Not on file  . Marital Status: Not on file  Intimate Partner Violence:   . Fear of Current or Ex-Partner: Not on file  . Emotionally Abused: Not on file  . Physically Abused: Not on file  . Sexually Abused: Not on file    Review of Systems  Constitutional: Negative for fever and unexpected weight change.  Respiratory: Positive for cough and shortness of breath.   Cardiovascular: Negative.   Gastrointestinal: Negative.     Vitals:   06/06/20 1326  BP: 132/74  Pulse: (!) 107  Temp: 98 F (36.7 C)  SpO2: 91%     Physical Exam Constitutional:      Appearance: Normal appearance.  HENT:     Head: Normocephalic and atraumatic.  Eyes:     General:        Right eye: No discharge.        Left eye: No discharge.     Pupils: Pupils are equal, round, and reactive to light.  Cardiovascular:     Rate and Rhythm: Normal rate and regular rhythm.     Heart sounds: No murmur heard.  No friction rub.  Pulmonary:     Effort: Pulmonary effort is normal. No respiratory distress.     Breath sounds: No stridor. Rales present. No wheezing or rhonchi.  Musculoskeletal:        General: No swelling.     Cervical back: No rigidity or tenderness.  Neurological:     Mental Status: He is alert.  Psychiatric:        Mood and Affect: Mood normal.    Data Reviewed: Chest x-ray from 11 years ago showed no infiltrative process Chest x-ray on 11/18/2019  shows mild atelectasis, emphysematous changes Increased markings in right upper lobe, left lung base-reviewed by myself CT scan reviewed showing some areas of fibrosis  Repeat CT scan reviewed with the patient showing stable findings, nodule in the left upper lobe looks more prominent needs followed up-reviewed with the patient  PFT reveals moderate  obstruction and moderate restrictive disease-not significantly changed from one performed about 3 months ago  Hypersensitivity panel negative, ANA negative, rheumatoid factor negative, ESR within normal limits  Assessment:  Recent bronchitis -Symptoms do not appear to be improving  Cough -Remains persistent -Mucinex DM not helping  History of emphysema  Abnormal CT scan of the chest showing a lung nodule and evidence of fibrosis -Unchanged from recent -The lung nodule is a bit more prominent but needs further follow-up  History of emphysema  Plan/Recommendations: We will repeat CT in 6 months  We will follow up on pulmonary function test on the line  Patient has been using breztri but not noticing significant improvement in symptoms  Prescription for Hycodan was given  As patient was wheezing today we will provide him with a nebulizer machine with albuterol to be used as needed Hypertonic saline to help with mucus clearance  Sputum for Gram stain and cultures and fungal studies  Bronchoscopy as a means of further investigation was discussed-if not able to provide an adequate sample of sputum-this may be needed  Call with significant concerns  BuSpar for anxiety  Patient had called a couple of days ago to try and get to see Dr. Loanne Drilling for an opinion-this is welcomed from me and I reassured the patient that it is okay to get another opinion  Sherrilyn Rist MD [facility] 06/06/2020, 1:43 PM  CC: Ephriam Jenkins, PA

## 2020-06-07 ENCOUNTER — Telehealth: Payer: Self-pay | Admitting: Pulmonary Disease

## 2020-06-07 NOTE — Telephone Encounter (Signed)
Called and spoke with Patient Wife Lucendia Herrlich.  Lucendia Herrlich stated they had to have Hycodan filled at [facility] and was only given .  Lucendia Herrlich was concerned about refilling Hycodan and prescription was written for . Lucendia Herrlich stated she was told by Pharmacist at [facility] that they had problems getting Hycodan and Patient got the last of [place] supply. Audrie Gallus to call office for refill when needed and have Hycodan filled at a larger chain pharmacy like [facility], or [facility]. Audrie Gallus to call pharmacy ahead of time for refill to check on Hycodan availability. Lucendia Herrlich was concerned Patient would not take Hycodan. Audrie Gallus to encourage Hycodan for cough when needed. I did let Lucendia Herrlich know I would let Dr.Olalere know Patient only received instead of .  Message routed to Dr. Wynona Neat as Lorain Childes

## 2020-06-08 NOTE — Telephone Encounter (Signed)
[place]

## 2020-06-09 ENCOUNTER — Emergency Department (HOSPITAL_COMMUNITY): Payer: Medicare Other

## 2020-06-09 ENCOUNTER — Inpatient Hospital Stay (HOSPITAL_COMMUNITY)
Admission: EM | Admit: 2020-06-09 | Discharge: 2020-06-14 | DRG: 871 | Disposition: A | Discharge status: Home Health | Payer: Medicare Other | Source: Ambulatory Visit | Attending: Internal Medicine | Admitting: Internal Medicine

## 2020-06-09 ENCOUNTER — Other Ambulatory Visit: Payer: Self-pay

## 2020-06-09 DIAGNOSIS — Z20822 Contact with and (suspected) exposure to covid-19: Secondary | ICD-10-CM | POA: Diagnosis present

## 2020-06-09 DIAGNOSIS — D62 Acute posthemorrhagic anemia: Secondary | ICD-10-CM | POA: Diagnosis present

## 2020-06-09 DIAGNOSIS — J8489 Other specified interstitial pulmonary diseases: Secondary | ICD-10-CM | POA: Diagnosis present

## 2020-06-09 DIAGNOSIS — I272 Pulmonary hypertension, unspecified: Secondary | ICD-10-CM | POA: Diagnosis present

## 2020-06-09 DIAGNOSIS — I480 Paroxysmal atrial fibrillation: Secondary | ICD-10-CM | POA: Diagnosis present

## 2020-06-09 DIAGNOSIS — I429 Cardiomyopathy, unspecified: Secondary | ICD-10-CM | POA: Diagnosis present

## 2020-06-09 DIAGNOSIS — R609 Edema, unspecified: Secondary | ICD-10-CM | POA: Diagnosis not present

## 2020-06-09 DIAGNOSIS — I361 Nonrheumatic tricuspid (valve) insufficiency: Secondary | ICD-10-CM | POA: Diagnosis not present

## 2020-06-09 DIAGNOSIS — R410 Disorientation, unspecified: Secondary | ICD-10-CM | POA: Diagnosis not present

## 2020-06-09 DIAGNOSIS — I5043 Acute on chronic combined systolic (congestive) and diastolic (congestive) heart failure: Secondary | ICD-10-CM | POA: Diagnosis not present

## 2020-06-09 DIAGNOSIS — T380X5A Adverse effect of glucocorticoids and synthetic analogues, initial encounter: Secondary | ICD-10-CM | POA: Diagnosis not present

## 2020-06-09 DIAGNOSIS — A419 Sepsis, unspecified organism: Secondary | ICD-10-CM | POA: Diagnosis present

## 2020-06-09 DIAGNOSIS — K7200 Acute and subacute hepatic failure without coma: Secondary | ICD-10-CM | POA: Diagnosis not present

## 2020-06-09 DIAGNOSIS — Z800 Family history of malignant neoplasm of digestive organs: Secondary | ICD-10-CM

## 2020-06-09 DIAGNOSIS — E222 Syndrome of inappropriate secretion of antidiuretic hormone: Secondary | ICD-10-CM | POA: Diagnosis present

## 2020-06-09 DIAGNOSIS — Z880 Allergy status to penicillin: Secondary | ICD-10-CM

## 2020-06-09 DIAGNOSIS — J189 Pneumonia, unspecified organism: Secondary | ICD-10-CM | POA: Diagnosis present

## 2020-06-09 DIAGNOSIS — J9601 Acute respiratory failure with hypoxia: Secondary | ICD-10-CM | POA: Diagnosis present

## 2020-06-09 DIAGNOSIS — J8410 Pulmonary fibrosis, unspecified: Secondary | ICD-10-CM | POA: Diagnosis present

## 2020-06-09 DIAGNOSIS — I4891 Unspecified atrial fibrillation: Secondary | ICD-10-CM | POA: Diagnosis present

## 2020-06-09 DIAGNOSIS — K219 Gastro-esophageal reflux disease without esophagitis: Secondary | ICD-10-CM | POA: Diagnosis present

## 2020-06-09 DIAGNOSIS — Z8711 Personal history of peptic ulcer disease: Secondary | ICD-10-CM

## 2020-06-09 DIAGNOSIS — M6281 Muscle weakness (generalized): Secondary | ICD-10-CM | POA: Diagnosis present

## 2020-06-09 DIAGNOSIS — I351 Nonrheumatic aortic (valve) insufficiency: Secondary | ICD-10-CM | POA: Diagnosis not present

## 2020-06-09 DIAGNOSIS — Z79899 Other long term (current) drug therapy: Secondary | ICD-10-CM | POA: Diagnosis not present

## 2020-06-09 DIAGNOSIS — N183 Chronic kidney disease, stage 3 unspecified: Secondary | ICD-10-CM | POA: Diagnosis present

## 2020-06-09 DIAGNOSIS — R700 Elevated erythrocyte sedimentation rate: Secondary | ICD-10-CM | POA: Diagnosis present

## 2020-06-09 DIAGNOSIS — F09 Unspecified mental disorder due to known physiological condition: Secondary | ICD-10-CM | POA: Diagnosis not present

## 2020-06-09 DIAGNOSIS — N179 Acute kidney failure, unspecified: Secondary | ICD-10-CM | POA: Diagnosis present

## 2020-06-09 DIAGNOSIS — M170 Bilateral primary osteoarthritis of knee: Secondary | ICD-10-CM | POA: Diagnosis present

## 2020-06-09 DIAGNOSIS — R7401 Elevation of levels of liver transaminase levels: Secondary | ICD-10-CM | POA: Diagnosis present

## 2020-06-09 DIAGNOSIS — E871 Hypo-osmolality and hyponatremia: Secondary | ICD-10-CM | POA: Diagnosis present

## 2020-06-09 DIAGNOSIS — R000 Tachycardia, unspecified: Secondary | ICD-10-CM | POA: Diagnosis present

## 2020-06-09 DIAGNOSIS — R042 Hemoptysis: Secondary | ICD-10-CM | POA: Diagnosis present

## 2020-06-09 DIAGNOSIS — R627 Adult failure to thrive: Secondary | ICD-10-CM | POA: Diagnosis present

## 2020-06-09 DIAGNOSIS — Z6828 Body mass index (BMI) 28.0-28.9, adult: Secondary | ICD-10-CM

## 2020-06-09 DIAGNOSIS — J849 Interstitial pulmonary disease, unspecified: Secondary | ICD-10-CM

## 2020-06-09 LAB — COMPREHENSIVE METABOLIC PANEL
ALT: 551 U/L — ABNORMAL HIGH (ref 0–44)
AST: 663 U/L — ABNORMAL HIGH (ref 15–41)
Albumin: 2.5 g/dL — ABNORMAL LOW (ref 3.5–5.0)
Alkaline Phosphatase: 135 U/L — ABNORMAL HIGH (ref 38–126)
Anion gap: 13 (ref 5–15)
BUN: 54 mg/dL — ABNORMAL HIGH (ref 8–23)
CO2: 20 mmol/L — ABNORMAL LOW (ref 22–32)
Calcium: 8.6 mg/dL — ABNORMAL LOW (ref 8.9–10.3)
Chloride: 99 mmol/L (ref 98–111)
Creatinine, Ser: 2.11 mg/dL — ABNORMAL HIGH (ref 0.61–1.24)
GFR, Estimated: 31 mL/min — ABNORMAL LOW (ref >60)
Glucose, Bld: 143 mg/dL — ABNORMAL HIGH (ref 70–99)
Potassium: 4.9 mmol/L (ref 3.5–5.1)
Sodium: 132 mmol/L — ABNORMAL LOW (ref 135–145)
Total Bilirubin: 0.9 mg/dL (ref 0.3–1.2)
Total Protein: 6.5 g/dL (ref 6.5–8.1)

## 2020-06-09 LAB — CBC WITH DIFFERENTIAL/PLATELET
Abs Immature Granulocytes: 0.09 10*3/uL — ABNORMAL HIGH (ref 0.00–0.07)
Basophils Absolute: 0 10*3/uL (ref 0.0–0.1)
Basophils Relative: 0 %
Eosinophils Absolute: 0 10*3/uL (ref 0.0–0.5)
Eosinophils Relative: 0 %
HCT: 32.7 % — ABNORMAL LOW (ref 39.0–52.0)
Hemoglobin: 10.7 g/dL — ABNORMAL LOW (ref 13.0–17.0)
Immature Granulocytes: 1 %
Lymphocytes Relative: 10 %
Lymphs Abs: 1 10*3/uL (ref 0.7–4.0)
MCH: 31 pg (ref 26.0–34.0)
MCHC: 32.7 g/dL (ref 30.0–36.0)
MCV: 94.8 fL (ref 80.0–100.0)
Monocytes Absolute: 0.6 10*3/uL (ref 0.1–1.0)
Monocytes Relative: 6 %
Neutro Abs: 8.4 10*3/uL — ABNORMAL HIGH (ref 1.7–7.7)
Neutrophils Relative %: 83 %
Platelets: 301 10*3/uL (ref 150–400)
RBC: 3.45 MIL/uL — ABNORMAL LOW (ref 4.22–5.81)
RDW: 13.7 % (ref 11.5–15.5)
WBC: 10.1 10*3/uL (ref 4.0–10.5)
nRBC: 0 % (ref 0.0–0.2)

## 2020-06-09 LAB — BRAIN NATRIURETIC PEPTIDE: B Natriuretic Peptide: 745 pg/mL — ABNORMAL HIGH (ref 0.0–100.0)

## 2020-06-09 LAB — URINALYSIS, ROUTINE W REFLEX MICROSCOPIC
Bilirubin Urine: NEGATIVE
Glucose, UA: NEGATIVE mg/dL
Hgb urine dipstick: NEGATIVE
Ketones, ur: NEGATIVE mg/dL
Leukocytes,Ua: NEGATIVE
Nitrite: NEGATIVE
Protein, ur: NEGATIVE mg/dL
Specific Gravity, Urine: 1.018 (ref 1.005–1.030)
pH: 5 (ref 5.0–8.0)

## 2020-06-09 LAB — MAGNESIUM: Magnesium: 2.3 mg/dL (ref 1.7–2.4)

## 2020-06-09 LAB — PROTIME-INR
INR: 1.8 — ABNORMAL HIGH (ref 0.8–1.2)
Prothrombin Time: 19.9 seconds — ABNORMAL HIGH (ref 11.4–15.2)

## 2020-06-09 LAB — TROPONIN I (HIGH SENSITIVITY)
Troponin I (High Sensitivity): 40 ng/L — ABNORMAL HIGH (ref <18)
Troponin I (High Sensitivity): 39 ng/L — ABNORMAL HIGH (ref <18)

## 2020-06-09 LAB — TSH: TSH: 2.645 u[IU]/mL (ref 0.350–4.500)

## 2020-06-09 MED ORDER — SODIUM CHLORIDE 0.9 % IV SOLN
1.0000 g | Freq: Once | INTRAVENOUS | Status: AC
  Administered 2020-06-09: 1 g via INTRAVENOUS
  Filled 2020-06-09: qty 10

## 2020-06-09 MED ORDER — SODIUM CHLORIDE 0.9 % IV SOLN
500.0000 mg | Freq: Once | INTRAVENOUS | Status: AC
  Administered 2020-06-09: 500 mg via INTRAVENOUS
  Filled 2020-06-09: qty 500

## 2020-06-09 MED ORDER — HEPARIN BOLUS VIA INFUSION
4000.0000 [IU] | Freq: Once | INTRAVENOUS | Status: AC
  Administered 2020-06-09: 4000 [IU] via INTRAVENOUS
  Filled 2020-06-09: qty 4000

## 2020-06-09 MED ORDER — HEPARIN (PORCINE) 25000 UT/250ML-% IV SOLN
1500.0000 [IU]/h | INTRAVENOUS | Status: DC
  Administered 2020-06-09: 17:00:00 1200 [IU]/h via INTRAVENOUS
  Administered 2020-06-10: 08:00:00 1400 [IU]/h via INTRAVENOUS
  Administered 2020-06-11 (×2): 1650 [IU]/h via INTRAVENOUS
  Filled 2020-06-09 (×4): qty 250

## 2020-06-09 MED ORDER — IPRATROPIUM-ALBUTEROL 0.5-2.5 (3) MG/3ML IN SOLN
9.0000 mL | Freq: Once | RESPIRATORY_TRACT | Status: AC
  Administered 2020-06-09: 9 mL via RESPIRATORY_TRACT
  Filled 2020-06-09: qty 3

## 2020-06-09 MED ORDER — DILTIAZEM LOAD VIA INFUSION
10.0000 mg | Freq: Once | INTRAVENOUS | Status: AC
  Administered 2020-06-09: 10 mg via INTRAVENOUS
  Filled 2020-06-09: qty 10

## 2020-06-09 MED ORDER — ALBUTEROL SULFATE HFA 108 (90 BASE) MCG/ACT IN AERS
6.0000 | INHALATION_SPRAY | Freq: Once | RESPIRATORY_TRACT | Status: AC
  Administered 2020-06-09: 6 via RESPIRATORY_TRACT
  Filled 2020-06-09: qty 6.7

## 2020-06-09 MED ORDER — DILTIAZEM HCL-DEXTROSE 125-5 MG/125ML-% IV SOLN (PREMIX)
5.0000 mg/h | INTRAVENOUS | Status: DC
  Administered 2020-06-09: 5 mg/h via INTRAVENOUS
  Administered 2020-06-10: 05:00:00 10 mg/h via INTRAVENOUS
  Administered 2020-06-11: 07:00:00 5 mg/h via INTRAVENOUS
  Filled 2020-06-09 (×4): qty 125

## 2020-06-09 NOTE — ED Provider Notes (Signed)
[facility] EMERGENCY DEPARTMENT Provider Note   CSN: [id number] Arrival date & time: 06/09/20  1542     History Chief Complaint  Patient presents with  . Shortness of Breath    Justin Mccarty is a 84 y.o. male with a PMH of pulmonary fibrosis presents with 2 weeks of shortness of breath that worsened in the past 1 to 2 days.  Associated with cough, increased pain production, but no change in purulence.  Was seen at PCP and was noted to have an irregular rhythm so was sent to the ED.  Per EMS, was satting 78% on room air, no previous oxygen requirement.  Was placed on 6 L nasal cannula with improvement to 88%.  On arrival to the ED was satting 89% on room air, placed on 2 L nasal cannula with improvement to 90%.  Patient describes 2 episodes of scant hemoptysis about 1 week ago but no further hemoptysis.  No recent surgeries.  No unilateral leg swelling.  No chest pain.  Is not on anticoagulation or rate/rhythm control for A. fib.  The history is provided by the patient, a relative and the EMS personnel.  Shortness of Breath Severity:  Moderate Onset quality:  Gradual Duration:  2 weeks Timing:  Constant Progression:  Worsening Chronicity:  New Context: not URI   Relieved by:  Nothing Worsened by:  Nothing Ineffective treatments: albuterol. Associated symptoms: cough, hemoptysis (2 episodes about a week ago), sputum production and wheezing   Associated symptoms: no abdominal pain, no chest pain, no ear pain, no fever, no headaches, no rash, no sore throat, no syncope and no vomiting        Past Medical History:  Diagnosis Date  . Acid reflux disease   . Acute blood loss anemia    Postoperative  . Cellulitis    History of Cellulitis  . History of sepsis   . Hypercholesterolemia    Mild  . Hyponatremia       . 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 Transfusion history    Status post transfusion without sequelae    Patient Active Problem List   Diagnosis  Date Noted  . New onset a-fib (HCC) 06/09/2020  . Community acquired pneumonia 06/09/2020  . Hyponatremia 06/09/2020  . AKI (acute kidney injury) (HCC) 06/09/2020  . Transaminitis 06/09/2020  . Sepsis (HCC) 06/09/2020  . Sinus bradycardia 08/25/2012    Past Surgical History:  Procedure Laterality Date  . HERNIA REPAIR     Abdominal  . KNEE SURGERY    . TRANSURETHRAL RESECTION OF PROSTATE         Family History  Problem Relation Age of Onset  . Bone cancer Mother   . Colon cancer Sister     Social History   Tobacco Use  . Smoking status: Never Smoker  . Smokeless tobacco: Never Used  Substance Use Topics  . Alcohol use: No    Home Medications Prior to Admission medications   Medication Sig Start Date End Date Taking? Authorizing Provider  albuterol (PROVENTIL) (2.5 MG/3ML) 0.083% nebulizer solution Take 3 mLs (2.5 mg total) by nebulization every 6 (six) hours as needed for wheezing or shortness of breath. 06/06/20  Yes Olalere, Adewale A, MD  Budeson-Glycopyrrol-Formoterol (BREZTRI AEROSPHERE) 160-9-4.8 MCG/ACT AERO Inhale 2 puffs into the lungs in the morning and at bedtime. 04/20/20  Yes Olalere, Adewale A, MD  busPIRone (BUSPAR) 5 MG tablet Take 1 tablet (5 mg total) by mouth 3 (three)  times daily. 06/06/20  Yes Olalere, Adewale A, MD  Cyanocobalamin (VITAMIN B 12 PO) Take 2 capsules by mouth in the morning and at bedtime.    Yes [provider]  glucosamine-chondroitin 500-400 MG tablet Take 1 tablet by mouth in the morning and at bedtime.   Yes [provider]  Multiple Vitamins-Minerals (ICAPS AREDS 2 PO) Take 1 capsule by mouth in the morning and at bedtime.   Yes [provider]  Omega-3 Fatty Acids (FISH OIL PO) Take 1 capsule by mouth 2 (two) times daily.    Yes [provider]  VITAMIN D PO Take 2 tablets by mouth daily.   Yes [provider]  Budeson-Glycopyrrol-Formoterol (BREZTRI AEROSPHERE) 160-9-4.8 MCG/ACT AERO  Inhale 1 puff into the lungs in the morning and at bedtime. Patient not taking: No sig reported 04/20/20   Virl Diamond A, MD  HYDROcodone-homatropine (HYCODAN) 5-1.5 MG/5ML syrup Take 5 mLs by mouth every 6 (six) hours as needed for cough. 06/06/20   Olalere, Onnie Boer A, MD  sodium chloride HYPERTONIC 3 % nebulizer solution Take by nebulization 2 (two) times daily as needed for other. Patient not taking: No sig reported 06/06/20   Virl Diamond A, MD    Allergies    Penicillin g  Review of Systems   Review of Systems  Constitutional: Negative for chills and fever.  HENT: Negative for ear pain and sore throat.   Eyes: Negative for pain and visual disturbance.  Respiratory: Positive for cough, hemoptysis (2 episodes about a week ago), sputum production, shortness of breath and wheezing.   Cardiovascular: Negative for chest pain, palpitations and syncope.  Gastrointestinal: Negative for abdominal pain and vomiting.  Genitourinary: Negative for dysuria and hematuria.  Musculoskeletal: Negative for arthralgias and back pain.  Skin: Negative for color change and rash.  Neurological: Negative for seizures, syncope and headaches.  All other systems reviewed and are negative.   Physical Exam Updated Vital Signs BP 114/78 (BP Location: Left Arm)   Pulse 95   Temp 97.9 F (36.6 C) (Oral)   Resp 16   Ht 5\' 7"  (1.702 m)   Wt 83.2 kg   SpO2 96%   BMI 28.73 kg/m   Physical Exam Vitals and nursing note reviewed.  Constitutional:      General: He is in acute distress (Increased work of breathing).     Appearance: He is well-developed and well-nourished. He is not ill-appearing or toxic-appearing.  HENT:     Head: Normocephalic and atraumatic.  Eyes:     Conjunctiva/sclera: Conjunctivae normal.  Cardiovascular:     Rate and Rhythm: Tachycardia present. Rhythm irregular.     Heart sounds: No murmur heard. No friction rub. No gallop.   Pulmonary:     Effort: Tachypnea, accessory  muscle usage and respiratory distress present.     Breath sounds: Decreased breath sounds, wheezing and rhonchi present.  Abdominal:     Palpations: Abdomen is soft.     Tenderness: There is no abdominal tenderness.  Musculoskeletal:     Cervical back: Neck supple.     Right lower leg: No tenderness. Edema present.     Left lower leg: No tenderness. Edema present.  Skin:    General: Skin is warm and dry.  Neurological:     Mental Status: He is alert.  Psychiatric:        Mood and Affect: Mood and affect and mood normal.        Behavior: Behavior normal.  ED Results / Procedures / Treatments   Labs (all labs ordered are listed, but only abnormal results are displayed) Labs Reviewed  BRAIN NATRIURETIC PEPTIDE - Abnormal; Notable for the following components:      Result Value   B Natriuretic Peptide 745.0 (*)    All other components within normal limits  COMPREHENSIVE METABOLIC PANEL - Abnormal; Notable for the following components:   Sodium 132 (*)    CO2 20 (*)    Glucose, Bld 143 (*)    BUN 54 (*)    Creatinine, Ser 2.11 (*)    Calcium 8.6 (*)    Albumin 2.5 (*)    AST 663 (*)    ALT 551 (*)    Alkaline Phosphatase 135 (*)    GFR, Estimated 31 (*)    All other components within normal limits  CBC WITH DIFFERENTIAL/PLATELET - Abnormal; Notable for the following components:   RBC 3.45 (*)    Hemoglobin 10.7 (*)    HCT 32.7 (*)    Neutro Abs 8.4 (*)    Abs Immature Granulocytes 0.09 (*)    All other components within normal limits  PROTIME-INR - Abnormal; Notable for the following components:   Prothrombin Time 19.9 (*)    INR 1.8 (*)    All other components within normal limits  CBC WITH DIFFERENTIAL/PLATELET - Abnormal; Notable for the following components:   RBC 3.38 (*)    Hemoglobin 10.6 (*)    HCT 31.4 (*)    Neutro Abs 8.9 (*)    Abs Immature Granulocytes 0.08 (*)    All other components within normal limits  TROPONIN I (HIGH SENSITIVITY) - Abnormal;  Notable for the following components:   Troponin I (High Sensitivity) 40 (*)    All other components within normal limits  TROPONIN I (HIGH SENSITIVITY) - Abnormal; Notable for the following components:   Troponin I (High Sensitivity) 39 (*)    All other components within normal limits  RESP PANEL BY RT-PCR (FLU A&B, COVID) ARPGX2  CULTURE, BLOOD (ROUTINE X 2)  CULTURE, BLOOD (ROUTINE X 2)  URINALYSIS, ROUTINE W REFLEX MICROSCOPIC  MAGNESIUM  TSH  HEPARIN LEVEL (UNFRACTIONATED)  CBC  HIV ANTIBODY (ROUTINE TESTING W REFLEX)  STREP PNEUMONIAE URINARY ANTIGEN  COMPREHENSIVE METABOLIC PANEL    EKG EKG Interpretation  Date/Time:  Friday June 09 2020 15:51:20 EST Ventricular Rate:  123 PR Interval:    QRS Duration: 94 QT Interval:  316 QTC Calculation: 452 R Axis:   17 Text Interpretation: Atrial fibrillation RSR' in V1 or V2, probably normal variant Borderline T wave abnormalities Since last tracing Normal sinus rhythm replaced by afib Confirmed by Eber HongMiller, Brian ([id number]) on 06/09/2020 5:05:20 PM   Radiology DG Chest Port 1 View  Result Date: 06/09/2020 CLINICAL DATA:  Shortness of breath EXAM: PORTABLE CHEST 1 VIEW COMPARISON:  Chest x-ray 11/18/2019, CT chest 04/04/2020 FINDINGS: The heart size and mediastinal contours are unchanged. Aortic arch calcification. Interval development of hazy airspace opacity overlying previously noted coarsened interstitial markings/scarring within the right upper lobe, right middle lobe, lingula, and left lower lobe. Bilateral lower lobe calcified granulomas again noted. No pulmonary edema. Likely trace left pleural effusion. No pneumothorax. No acute osseous abnormality. IMPRESSION: 1. Interval development of multifocal infection/inflammation. Followup PA and lateral chest X-ray is recommended in 3-4 weeks following therapy to ensure resolution and exclude underlying malignancy. 2. Likely trace left pleural effusion. Electronically Signed   By:  Tish FredericksonMorgane  Naveau M.D.   On: 06/09/2020  16:32    Procedures Procedures (including critical care time)  Medications Ordered in ED Medications  diltiazem (CARDIZEM) 1 mg/mL load via infusion 10 mg (10 mg Intravenous Bolus from Bag 06/09/20 1637)    And  diltiazem (CARDIZEM) 125 mg in dextrose 5% 125 mL (1 mg/mL) infusion (10 mg/hr Intravenous Rate/Dose Change 06/09/20 1749)  heparin ADULT infusion 100 units/mL (25000 units/257mL sodium chloride 0.45%) (1,200 Units/hr Intravenous Handoff 06/10/20 0004)  0.9 %  sodium chloride infusion ( Intravenous New Bag/Given 06/10/20 0100)  cefTRIAXone (ROCEPHIN) 2 g in sodium chloride 0.9 % 100 mL IVPB (has no administration in time range)  azithromycin (ZITHROMAX) 500 mg in sodium chloride 0.9 % 250 mL IVPB (has no administration in time range)  albuterol (VENTOLIN HFA) 108 (90 Base) MCG/ACT inhaler 6 puff (6 puffs Inhalation Given 06/09/20 1624)  ipratropium-albuterol (DUONEB) 0.5-2.5 (3) MG/3ML nebulizer solution 9 mL (9 mLs Nebulization Given 06/09/20 1626)  heparin bolus via infusion 4,000 Units (4,000 Units Intravenous Bolus from Bag 06/09/20 1709)  cefTRIAXone (ROCEPHIN) 1 g in sodium chloride 0.9 % 100 mL IVPB (0 g Intravenous Stopped 06/09/20 1906)  azithromycin (ZITHROMAX) 500 mg in sodium chloride 0.9 % 250 mL IVPB (0 mg Intravenous Stopped 06/09/20 1924)    ED Course  I have reviewed the triage vital signs and the nursing notes.  Pertinent labs & imaging results that were available during my care of the patient were reviewed by me and considered in my medical decision making (see chart for details).    MDM Rules/Calculators/A&P                          MDM: Rainier Feuerborn is a 84 y.o. male who presents with shortness of breath as per above. I have reviewed the nursing documentation for past medical history, family history, and social history. Pertinent previous records reviewed. He is awake, alert.  Tachycardic with regular rhythm,  normotensive, satting 90% on 2 L nasal cannula. Afebrile. Physical exam is most notable for diffuse inspiratory and expiratory wheezing with accessory muscle usage.  Labs: Mag 2.3, bicarb 20, BUN 54, creatinine 2.11 (baseline 1.2) transaminitis, INR 1.8, BNP 745, TSH normal, troponin 40 EKG: A. fib with RVR, ventricular rate 123.  QRS 94, QTc 452. No signs of acute ischemia, infarct, or significant electrical abnormalities. No STEMI, ST depressions, or significant T wave inversions. No evidence of a High-Grade Conduction Block, WPW, Brugada Sign, ARVC, DeWinters T Waves, or Wellens Waves. Imaging: CXR demonstrating right upper lobe and left lower lobe opacities concerning for pneumonia. Consults: Hospitalist for admission Tx: DuoNeb's and albuterol.  10 mg diltiazem load and infusion.  Heparin bolus and infusion.  1 g IV Rocephin, 500 mg IV azithromycin.  Differential Dx: I am most concerned for pneumonia with new O2 requirement. Given history, physical exam, and work-up, I do not think he has ACS, PE, aortic dissection/aneurysm, COPD/CHF exacerbation, bacteremia, or sepsis  MDM: JAHKEEM KURKA is a 84 y.o. male with a history of pulmonary fibrosis presents with worsening shortness of breath with increased cough and sputum production.  Chest x-ray concerning for pneumonia.  Patient has diffuse inspiratory and expiratory wheezing with increased work of breathing on initial assessment.  This improved with duo nebs and albuterol.  Per son, he does not have any known history of emphysema or COPD.  Patient in A. fib with RVR without any acute CHF exacerbation so diltiazem given for rate control.  CHADS-VASc score of  2, moderate-high.  Given patient's A. fib and risk of stroke, will plan to start heparin for anticoagulation.  Patient denies any recent head trauma, GI bleeding, or other contraindications to anticoagulation.  Also initiate diltiazem with 10 mg bolus and infusion for A. Fib.  This plan was  discussed with the son who arrived at bedside.  Spoke with hospitalist Dr. Mikeal [place], handoff given.  Admitted in stable condition.  The plan for this patient was discussed with Dr. Hyacinth Meeker, who voiced agreement and who oversaw evaluation and treatment of this patient.   Final Clinical Impression(s) / ED Diagnoses Final diagnoses:  Atrial fibrillation with rapid ventricular response (HCC)  Multifocal pneumonia    Rx / DC Orders ED Discharge Orders         Ordered    Amb referral to AFIB Clinic        06/09/20 1618           Gershon Mussel, MD 06/10/20 David Stall    Eber Hong, MD 06/12/20 1559

## 2020-06-09 NOTE — ED Notes (Signed)
Pt advised of NPO status at this time.

## 2020-06-09 NOTE — Progress Notes (Signed)
ANTICOAGULATION CONSULT NOTE - Initial Consult  Pharmacy Consult for heparin Indication: atrial fibrillation  Allergies  Allergen Reactions  . Penicillin G Rash    Patient Measurements: Height: 5\' 7"  (170.2 cm) Weight: 83 kg (182 lb 15.7 oz) IBW/kg (Calculated) : 66.1 Heparin Dosing Weight: 83 kg   Vital Signs: Temp: 97.9 F (36.6 C) (12/10 1553) Temp Source: Oral (12/10 1553) BP: 129/101 (12/10 1553) Pulse Rate: 103 (12/10 1553)  Labs: No results for input(s): HGB, HCT, PLT, APTT, LABPROT, INR, HEPARINUNFRC, HEPRLOWMOCWT, CREATININE, CKTOTAL, CKMB, TROPONINIHS in the last 72 hours.  CrCl cannot be calculated (Patient's most recent lab result is older than the maximum 21 days allowed.).   Medical History: Past Medical History:  Diagnosis Date  . Acid reflux disease   . Acute blood loss anemia    Postoperative  . Cellulitis    History of Cellulitis  . History of sepsis   . Hypercholesterolemia    Mild  . Hyponatremia       . 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 Transfusion history    Status post transfusion without sequelae    Medications:  (Not in a hospital admission)   Assessment: 39 YOM with Afib RVR to start IV heparin for primary stroke prevention. Patient states that he is not on any anticoagulants at home.   H/H low. Plt wnl. SCr elevated at 2.11   Goal of Therapy:  Heparin level 0.3-0.7 units/ml Monitor platelets by anticoagulation protocol: Yes   Plan:  -Heparin 4000 units IV bolus followed by IV heparin infusion at 1200 units/hr -F/u 8 hr HL -Monitor daily HL, CBC and s/s of bleeding  97, PharmD., BCPS, BCCCP Clinical Pharmacist Please refer to [facility] for unit-specific pharmacist

## 2020-06-09 NOTE — ED Triage Notes (Signed)
BIB [facility] for complaints of shortness of breath and low O2 sats. PER EMS pt coming from doctors office where they discovered his O2 was 78 % on RA. EMS placed pt on 6 L O2 by [place] and sats only improved to 88%. Pt has audible wheezing, and noted to be labored with breathing. Pt endorses a cough that is productive and "slimy". Denies chest pain, fever, headache, n/v/d. Pt placed on 2 L O2 and currently satting at 95 % but noted to be  tachypneic at 30 breaths per min. Pt pink warm and dry.    Hx of emphysema and pulmonary fibrosis: VS per EMS.  132/66 bp AFIB- 70-100 Temp 97.9   Resp- 24

## 2020-06-09 NOTE — ED Notes (Signed)
Urine requested  ?

## 2020-06-09 NOTE — H&P (Signed)
History and Physical   BURTON GAHAN LGX:[id number] DOB: 11-10-1935 DOA: 06/09/2020  Referring MD/NP/PA: Dr. Eber Hong  PCP: Adrienne Mocha, PA   Outpatient Specialists: Dr. Virl Diamond  Patient coming from: Home  Chief Complaint: Shortness of breath  HPI: Justin Mccarty is a 84 y.o. male with medical history significant of pulmonary fibrosis, GERD, hyponatremia and acute blood loss anemia who was sent over to the ER by his pulmonologist after visit today showing oxygen sats 78% on room air.  He has had progressive worsening of his symptoms.  In the last 2 weeks he has gotten worse with cough increase pain and now he has oxygen requirement which is new.  During that visit also he was noted to have irregular rhythm.  He was placed on 6 L of oxygen and sent over to the ER.  He is now about 90% on 2 L.  His initial oxygen sat in the ER was 89%.  Patient found to be in A. fib with RVR.  No prior documented history of A. fib.  He is therefore being admitted with new onset A. fib with RVR and worsening hypoxemia.  His chest x-ray indicated right lung pneumonia but has no history of dysphagia and denied any choking.  No evidence that he may have had aspiration..  ED Course: Temperature 97.9, blood pressure 113/88, pulse 103 respiratory of 30 oxygen sats 88% on room air currently 96% 3 L.  Sodium 132 potassium 4.9 chloride 99 CO2 20 glucose 143 BUN 53 creatinine 2.11 calcium 8.6 gap of 13 magnesium 2.3.  Alkaline phosphatase 135 albumin 2.5 AST 663 ALT 551 total protein 6.5.  BNP is 745.  Troponin is 40 and then 39.  White count 10.1 hemoglobin 10.7 platelets 301, PT 13.9 INR 1.8 TSH 2.645.  Acute respiratory panel is negative.  Chest x-ray shows interval development of multifocal infection.  Likely trace left pleural effusion also.  Findings in the right upper lobe right middle lobe lingula and left lower lobe.  He will be admitted for treatment of right more than left pneumonia with A. fib with  RVR.  Review of Systems: As per HPI otherwise 10 point review of systems negative.    Past Medical History:  Diagnosis Date  . Acid reflux disease   . Acute blood loss anemia    Postoperative  . Cellulitis    History of Cellulitis  . History of sepsis   . Hypercholesterolemia    Mild  . Hyponatremia       . 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 Transfusion history    Status post transfusion without sequelae    Past Surgical History:  Procedure Laterality Date  . HERNIA REPAIR     Abdominal  . KNEE SURGERY    . TRANSURETHRAL RESECTION OF PROSTATE       reports that he has never smoked. He has never used smokeless tobacco. He reports that he does not drink alcohol. No history on file for drug use.  Allergies  Allergen Reactions  . Penicillin G Rash    Family History  Problem Relation Age of Onset  . Bone cancer Mother   . Colon cancer Sister      Prior to Admission medications   Medication Sig Start Date End Date Taking? Authorizing Provider  albuterol (PROVENTIL) (2.5 MG/3ML) 0.083% nebulizer solution Take 3 mLs (2.5 mg total) by nebulization every 6 (six) hours as needed for wheezing or shortness of breath. 06/06/20  Yes Olalere, Adewale A, MD  Budeson-Glycopyrrol-Formoterol (BREZTRI AEROSPHERE) 160-9-4.8 MCG/ACT AERO Inhale 2 puffs into the lungs in the morning and at bedtime. 04/20/20  Yes Olalere, Adewale A, MD  busPIRone (BUSPAR) 5 MG tablet Take 1 tablet (5 mg total) by mouth 3 (three) times daily. 06/06/20  Yes Olalere, Adewale A, MD  Cyanocobalamin (VITAMIN B 12 PO) Take 2 capsules by mouth in the morning and at bedtime.    Yes [provider]  glucosamine-chondroitin 500-400 MG tablet Take 1 tablet by mouth in the morning and at bedtime.   Yes [provider]  Multiple Vitamins-Minerals (ICAPS AREDS 2 PO) Take 1 capsule by mouth in the morning and at bedtime.   Yes [provider]  Omega-3 Fatty Acids (FISH OIL PO) Take 1 capsule by  mouth 2 (two) times daily.    Yes [provider]  VITAMIN D PO Take 2 tablets by mouth daily.   Yes [provider]  Budeson-Glycopyrrol-Formoterol (BREZTRI AEROSPHERE) 160-9-4.8 MCG/ACT AERO Inhale 1 puff into the lungs in the morning and at bedtime. Patient not taking: No sig reported 04/20/20   Virl Diamond A, MD  HYDROcodone-homatropine (HYCODAN) 5-1.5 MG/5ML syrup Take 5 mLs by mouth every 6 (six) hours as needed for cough. 06/06/20   Olalere, Onnie Boer A, MD  sodium chloride HYPERTONIC 3 % nebulizer solution Take by nebulization 2 (two) times daily as needed for other. Patient not taking: No sig reported 06/06/20   Tomma Lightning, MD    Physical Exam: Vitals:   06/09/20 1830 06/09/20 1845 06/09/20 1920 06/09/20 1928  BP:  101/74 118/63   Pulse:  61 89   Resp:  (!) 27 (!) 23   Temp:    97.7 F (36.5 C)  TempSrc:    Oral  SpO2: 97% 100% 93%   Weight:      Height:          Constitutional: Chronically ill looking, weak and tachypneic Vitals:   06/09/20 1830 06/09/20 1845 06/09/20 1920 06/09/20 1928  BP:  101/74 118/63   Pulse:  61 89   Resp:  (!) 27 (!) 23   Temp:    97.7 F (36.5 C)  TempSrc:    Oral  SpO2: 97% 100% 93%   Weight:      Height:       Eyes: PERRL, lids and conjunctivae normal ENMT: Mucous membranes are dry. Posterior pharynx clear of any exudate or lesions.Normal dentition.  Neck: normal, supple, no masses, no thyromegaly Respiratory: Decreased breath sounds, coarse breath sounds with diffuse bilateral crackles rhonchi no wheeze. No accessory muscle use.  Cardiovascular: Irregularly irregular rate and rhythm, no murmurs / rubs / gallops. No extremity edema. 2+ pedal pulses. No carotid bruits.  Abdomen: no tenderness, no masses palpated. No hepatosplenomegaly. Bowel sounds positive.  Musculoskeletal: no clubbing / cyanosis. No joint deformity upper and lower extremities. Good ROM, no contractures. Normal muscle tone.  Skin: no  rashes, lesions, ulcers. No induration Neurologic: CN 2-12 grossly intact. Sensation intact, DTR normal. Strength 5/5 in all 4.  Psychiatric: Normal judgment and insight. Alert and oriented x 3.  Anxious mood.     Labs on Admission: I have personally reviewed following labs and imaging studies  CBC: Recent Labs  Lab 06/09/20 1633  WBC 10.1  NEUTROABS 8.4*  HGB 10.7*  HCT 32.7*  MCV 94.8  PLT 301   Basic Metabolic Panel: Recent Labs  Lab 06/09/20 1633  NA 132*  K 4.9  CL 99  CO2 20*  GLUCOSE 143*  BUN 54*  CREATININE 2.11*  CALCIUM 8.6*  MG 2.3   GFR: Estimated Creatinine Clearance: 27.8 mL/min (A) (by C-G formula based on SCr of 2.11 mg/dL (H)). Liver Function Tests: Recent Labs  Lab 06/09/20 1633  AST 663*  ALT 551*  ALKPHOS 135*  BILITOT 0.9  PROT 6.5  ALBUMIN 2.5*   No results for input(s): LIPASE, AMYLASE in the last 168 hours. No results for input(s): AMMONIA in the last 168 hours. Coagulation Profile: Recent Labs  Lab 06/09/20 1633  INR 1.8*   Cardiac Enzymes: No results for input(s): CKTOTAL, CKMB, CKMBINDEX, TROPONINI in the last 168 hours. BNP (last 3 results) No results for input(s): PROBNP in the last 8760 hours. HbA1C: No results for input(s): HGBA1C in the last 72 hours. CBG: No results for input(s): GLUCAP in the last 168 hours. Lipid Profile: No results for input(s): CHOL, HDL, LDLCALC, TRIG, CHOLHDL, LDLDIRECT in the last 72 hours. Thyroid Function Tests: Recent Labs    06/09/20 1634  TSH 2.645   Anemia Panel: No results for input(s): VITAMINB12, FOLATE, FERRITIN, TIBC, IRON, RETICCTPCT in the last 72 hours. Urine analysis:    Component Value Date/Time   COLORURINE YELLOW 09/13/2008 0820   APPEARANCEUR CLEAR 09/13/2008 0820   LABSPEC 1.018 09/13/2008 0820   PHURINE 5.5 09/13/2008 0820   GLUCOSEU NEGATIVE 09/13/2008 0820   HGBUR NEGATIVE 09/13/2008 0820   BILIRUBINUR NEGATIVE 09/13/2008 0820   KETONESUR NEGATIVE  09/13/2008 0820   PROTEINUR NEGATIVE 09/13/2008 0820   UROBILINOGEN 0.2 09/13/2008 0820   NITRITE NEGATIVE 09/13/2008 0820   LEUKOCYTESUR  09/13/2008 0820    NEGATIVE MICROSCOPIC NOT DONE ON URINES WITH NEGATIVE PROTEIN, BLOOD, LEUKOCYTES, NITRITE, OR GLUCOSE <1000 mg/dL.   Sepsis Labs: @LABRCNTIP (procalcitonin:4,lacticidven:4) )No results found for this or any previous visit (from the past 240 hour(s)).   Radiological Exams on Admission: DG Chest Port 1 View  Result Date: 06/09/2020 CLINICAL DATA:  Shortness of breath EXAM: PORTABLE CHEST 1 VIEW COMPARISON:  Chest x-ray 11/18/2019, CT chest 04/04/2020 FINDINGS: The heart size and mediastinal contours are unchanged. Aortic arch calcification. Interval development of hazy airspace opacity overlying previously noted coarsened interstitial markings/scarring within the right upper lobe, right middle lobe, lingula, and left lower lobe. Bilateral lower lobe calcified granulomas again noted. No pulmonary edema. Likely trace left pleural effusion. No pneumothorax. No acute osseous abnormality. IMPRESSION: 1. Interval development of multifocal infection/inflammation. Followup PA and lateral chest X-ray is recommended in 3-4 weeks following therapy to ensure resolution and exclude underlying malignancy. 2. Likely trace left pleural effusion. Electronically Signed   By: Tish FredericksonMorgane  Naveau M.D.   On: 06/09/2020 16:32    EKG: Independently reviewed.  Irregular rate irregular with a rate of 120s, no significant ST changes  Assessment/Plan Principal Problem:   New onset a-fib (HCC) Active Problems:   Community acquired pneumonia   Hyponatremia   AKI (acute kidney injury) (HCC)   Transaminitis   Sepsis (HCC)     #1 new onset A. fib with RVR: Patient initiated on IV Cardizem drip and IV heparin.  We will get echocardiogram as well as cardiology consult in the morning.  Patient will likely be discharged on oral Cardizem and anticoagulation.  At this point  appears hemodynamically stable.  #2 multifocal pneumonia: Must be the cause of patient's hypoxia now requiring oxygen.  He has longstanding pulmonary fibrosis but seems to be new oxygen requirement.  Continue treatment  #3 pulmonary fibrosis: Chronic  being followed by pulmonology.  Continue treatment  #4 AKI: Continue to hydrate.  Most likely prerenal  #5 sepsis: Patient meets sepsis criteria most likely due to pneumonia.  Follow sepsis protocol  #6 hyponatremia: Hydrate and follow sodium level  #7 transaminitis: Probably due to sepsis.  Continue treatment   DVT prophylaxis: Lovenox Code Status: Full code Family Communication: Son at bedside Disposition Plan: Home Consults called: None but consult cardiology in the morning Admission status: Inpatient to progressive care  Severity of Illness: The appropriate patient status for this patient is INPATIENT. Inpatient status is judged to be reasonable and necessary in order to provide the required intensity of service to ensure the patient's safety. The patient's presenting symptoms, physical exam findings, and initial radiographic and laboratory data in the context of their chronic comorbidities is felt to place them at high risk for further clinical deterioration. Furthermore, it is not anticipated that the patient will be medically stable for discharge from the hospital within 2 midnights of admission. The following factors support the patient status of inpatient.   " The patient's presenting symptoms include shortness of breath and palpitation. " The worrisome physical exam findings include} sounds with tachycardia. " The initial radiographic and laboratory data are worrisome because of x-ray findings of pulmonary fibrosis. " The chronic co-morbidities include pulmonary fibrosis.   * I certify that at the point of admission it is my clinical judgment that the patient will require inpatient hospital care spanning beyond 2 midnights from  the point of admission due to high intensity of service, high risk for further deterioration and high frequency of surveillance required.Lonia Blood MD [facility] Pager [phone]  If 7PM-7AM, please contact night-coverage [email or web address] Password [email or web address]  06/09/2020, 8:20 PM

## 2020-06-09 NOTE — ED Provider Notes (Signed)
I saw and evaluated the patient, reviewed the resident's note and I agree with the findings and plan.  Pertinent History: This patient is an ill-appearing 84 year old male with a history of pulmonary fibrosis, was found to be hypoxic from the doctor's office at 78% on room air.  He came up to 95% with oxygen.  He is tachypneic and has rales diffusely, he also has pitting edema bilaterally which is symmetrical and 1+.  Cardiac exam remarkable for atrial fibrillation with RVR pulses ranging from 95-130.  I viewed the patient's chest x-ray, there is an obvious left lower lobe infiltrate as well as right upper lobe infiltrate.  The EKG does not fact confirm atrial fibrillation with RVR  The patient was given antibiotics, albuterol nebulizer therapy for his tachypnea and hypoxia and will need to be admitted to the hospital to high level of care secondary to these conditions.  He is critically ill with the need for heparin drip, diltiazem drip and antibiotics.  .Critical Care Performed by: Eber Hong, MD Authorized by: Eber Hong, MD   Critical care provider statement:    Critical care time (minutes):  35   Critical care time was exclusive of:  Separately billable procedures and treating other patients and teaching time   Critical care was necessary to treat or prevent imminent or life-threatening deterioration of the following conditions:  Respiratory failure   Critical care was time spent personally by me on the following activities:  Blood draw for specimens, development of treatment plan with patient or surrogate, discussions with consultants, evaluation of patient's response to treatment, examination of patient, obtaining history from patient or surrogate, ordering and performing treatments and interventions, ordering and review of laboratory studies, ordering and review of radiographic studies, pulse oximetry, re-evaluation of patient's condition and review of old charts    EKG  Interpretation  Date/Time:  Friday June 09 2020 15:51:20 EST Ventricular Rate:  123 PR Interval:    QRS Duration: 94 QT Interval:  316 QTC Calculation: 452 R Axis:   17 Text Interpretation: Atrial fibrillation RSR' in V1 or V2, probably normal variant Borderline T wave abnormalities Since last tracing Normal sinus rhythm replaced by afib Confirmed by Eber Hong ([id number]) on 06/09/2020 5:05:20 PM        I was personally present and directly supervised the following procedures:  Medical Rescucitation Critical care  I personally interpreted the EKG as well as the resident and agree with the interpretation on the resident's chart.  Final diagnoses:  Atrial fibrillation with rapid ventricular response (HCC)  Multifocal pneumonia      Eber Hong, MD 06/12/20 1559

## 2020-06-09 NOTE — ED Notes (Signed)
Report given to floor Natalia Leatherwood, RN

## 2020-06-10 ENCOUNTER — Encounter (HOSPITAL_COMMUNITY): Payer: Self-pay | Admitting: Internal Medicine

## 2020-06-10 ENCOUNTER — Inpatient Hospital Stay (HOSPITAL_COMMUNITY): Payer: Medicare Other

## 2020-06-10 DIAGNOSIS — I361 Nonrheumatic tricuspid (valve) insufficiency: Secondary | ICD-10-CM

## 2020-06-10 DIAGNOSIS — I4891 Unspecified atrial fibrillation: Secondary | ICD-10-CM

## 2020-06-10 DIAGNOSIS — I351 Nonrheumatic aortic (valve) insufficiency: Secondary | ICD-10-CM

## 2020-06-10 LAB — COMPREHENSIVE METABOLIC PANEL
ALT: 495 U/L — ABNORMAL HIGH (ref 0–44)
AST: 404 U/L — ABNORMAL HIGH (ref 15–41)
Albumin: 2.5 g/dL — ABNORMAL LOW (ref 3.5–5.0)
Alkaline Phosphatase: 122 U/L (ref 38–126)
Anion gap: 10 (ref 5–15)
BUN: 54 mg/dL — ABNORMAL HIGH (ref 8–23)
CO2: 21 mmol/L — ABNORMAL LOW (ref 22–32)
Calcium: 8.6 mg/dL — ABNORMAL LOW (ref 8.9–10.3)
Chloride: 98 mmol/L (ref 98–111)
Creatinine, Ser: 2.04 mg/dL — ABNORMAL HIGH (ref 0.61–1.24)
GFR, Estimated: 32 mL/min — ABNORMAL LOW (ref >60)
Glucose, Bld: 213 mg/dL — ABNORMAL HIGH (ref 70–99)
Potassium: 5.1 mmol/L (ref 3.5–5.1)
Sodium: 129 mmol/L — ABNORMAL LOW (ref 135–145)
Total Bilirubin: 0.7 mg/dL (ref 0.3–1.2)
Total Protein: 6.6 g/dL (ref 6.5–8.1)

## 2020-06-10 LAB — TSH: TSH: 1.449 u[IU]/mL (ref 0.350–4.500)

## 2020-06-10 LAB — HEPARIN LEVEL (UNFRACTIONATED)
Heparin Unfractionated: 0.11 IU/mL — ABNORMAL LOW (ref 0.30–0.70)
Heparin Unfractionated: 0.24 IU/mL — ABNORMAL LOW (ref 0.30–0.70)
Heparin Unfractionated: 0.26 IU/mL — ABNORMAL LOW (ref 0.30–0.70)

## 2020-06-10 LAB — ECHOCARDIOGRAM COMPLETE
AR max vel: 2.62 cm2
AV Area VTI: 2.88 cm2
AV Area mean vel: 2.49 cm2
AV Mean grad: 2 mmHg
AV Peak grad: 3.6 mmHg
Ao pk vel: 0.95 m/s
Height: 67 in
P 1/2 time: 988 msec
S' Lateral: 4 cm
Weight: 2934.76 oz

## 2020-06-10 LAB — OSMOLALITY, URINE: Osmolality, Ur: 611 mOsm/kg (ref 300–900)

## 2020-06-10 LAB — CBC WITH DIFFERENTIAL/PLATELET
Abs Immature Granulocytes: 0.08 10*3/uL — ABNORMAL HIGH (ref 0.00–0.07)
Basophils Absolute: 0 10*3/uL (ref 0.0–0.1)
Basophils Relative: 0 %
Eosinophils Absolute: 0 10*3/uL (ref 0.0–0.5)
Eosinophils Relative: 0 %
HCT: 31.4 % — ABNORMAL LOW (ref 39.0–52.0)
Hemoglobin: 10.6 g/dL — ABNORMAL LOW (ref 13.0–17.0)
Immature Granulocytes: 1 %
Lymphocytes Relative: 8 %
Lymphs Abs: 0.8 10*3/uL (ref 0.7–4.0)
MCH: 31.4 pg (ref 26.0–34.0)
MCHC: 33.8 g/dL (ref 30.0–36.0)
MCV: 92.9 fL (ref 80.0–100.0)
Monocytes Absolute: 0.2 10*3/uL (ref 0.1–1.0)
Monocytes Relative: 2 %
Neutro Abs: 8.9 10*3/uL — ABNORMAL HIGH (ref 1.7–7.7)
Neutrophils Relative %: 89 %
Platelets: 290 10*3/uL (ref 150–400)
RBC: 3.38 MIL/uL — ABNORMAL LOW (ref 4.22–5.81)
RDW: 13.4 % (ref 11.5–15.5)
WBC: 9.9 10*3/uL (ref 4.0–10.5)
nRBC: 0 % (ref 0.0–0.2)

## 2020-06-10 LAB — PROTEIN / CREATININE RATIO, URINE
Creatinine, Urine: 114.1 mg/dL
Protein Creatinine Ratio: 0.3 mg/mg{Cre} — ABNORMAL HIGH (ref 0.00–0.15)
Total Protein, Urine: 34 mg/dL

## 2020-06-10 LAB — RESPIRATORY PANEL BY PCR

## 2020-06-10 LAB — CK: Total CK: 42 U/L — ABNORMAL LOW (ref 49–397)

## 2020-06-10 LAB — RESP PANEL BY RT-PCR (FLU A&B, COVID) ARPGX2
Influenza A by PCR: NEGATIVE
Influenza B by PCR: NEGATIVE
SARS Coronavirus 2 by RT PCR: NEGATIVE

## 2020-06-10 LAB — C-REACTIVE PROTEIN: CRP: 12.7 mg/dL — ABNORMAL HIGH (ref <1.0)

## 2020-06-10 LAB — SEDIMENTATION RATE: Sed Rate: 58 mm/hr — ABNORMAL HIGH (ref 0–16)

## 2020-06-10 LAB — CORTISOL: Cortisol, Plasma: 14.2 ug/dL

## 2020-06-10 LAB — OSMOLALITY: Osmolality: 300 mOsm/kg — ABNORMAL HIGH (ref 275–295)

## 2020-06-10 LAB — LACTATE DEHYDROGENASE: LDH: 279 U/L — ABNORMAL HIGH (ref 98–192)

## 2020-06-10 LAB — PROCALCITONIN: Procalcitonin: 0.69 ng/mL

## 2020-06-10 LAB — STREP PNEUMONIAE URINARY ANTIGEN: Strep Pneumo Urinary Antigen: NEGATIVE

## 2020-06-10 LAB — HIV ANTIBODY (ROUTINE TESTING W REFLEX): HIV Screen 4th Generation wRfx: NONREACTIVE

## 2020-06-10 MED ORDER — LEVALBUTEROL HCL 0.63 MG/3ML IN NEBU
0.6300 mg | INHALATION_SOLUTION | Freq: Four times a day (QID) | RESPIRATORY_TRACT | Status: DC
  Administered 2020-06-10 – 2020-06-11 (×3): 0.63 mg via RESPIRATORY_TRACT
  Filled 2020-06-10 (×3): qty 3

## 2020-06-10 MED ORDER — HEPARIN BOLUS VIA INFUSION
2000.0000 [IU] | Freq: Once | INTRAVENOUS | Status: AC
  Administered 2020-06-10: 02:00:00 2000 [IU] via INTRAVENOUS
  Filled 2020-06-10: qty 2000

## 2020-06-10 MED ORDER — SODIUM CHLORIDE 0.9 % IV SOLN
500.0000 mg | INTRAVENOUS | Status: DC
  Administered 2020-06-10 – 2020-06-13 (×4): 500 mg via INTRAVENOUS
  Filled 2020-06-10 (×5): qty 500

## 2020-06-10 MED ORDER — GUAIFENESIN-DM 100-10 MG/5ML PO SYRP
5.0000 mL | ORAL_SOLUTION | ORAL | Status: DC | PRN
  Administered 2020-06-10: 08:00:00 5 mL via ORAL
  Filled 2020-06-10: qty 5

## 2020-06-10 MED ORDER — ALBUTEROL SULFATE (2.5 MG/3ML) 0.083% IN NEBU
2.5000 mg | INHALATION_SOLUTION | Freq: Four times a day (QID) | RESPIRATORY_TRACT | Status: DC | PRN
  Administered 2020-06-10 (×2): 2.5 mg via RESPIRATORY_TRACT
  Filled 2020-06-10 (×2): qty 3

## 2020-06-10 MED ORDER — CEFTRIAXONE SODIUM 2 G IJ SOLR
2.0000 g | INTRAMUSCULAR | Status: DC
  Administered 2020-06-10 – 2020-06-13 (×4): 2 g via INTRAVENOUS
  Filled 2020-06-10: qty 20
  Filled 2020-06-10 (×3): qty 2
  Filled 2020-06-10: qty 20

## 2020-06-10 MED ORDER — FUROSEMIDE 10 MG/ML IJ SOLN
40.0000 mg | Freq: Once | INTRAMUSCULAR | Status: AC
  Administered 2020-06-10: 16:00:00 40 mg via INTRAVENOUS
  Filled 2020-06-10: qty 4

## 2020-06-10 MED ORDER — PREDNISONE 20 MG PO TABS
60.0000 mg | ORAL_TABLET | Freq: Every day | ORAL | Status: DC
  Administered 2020-06-10 – 2020-06-11 (×2): 60 mg via ORAL
  Filled 2020-06-10 (×2): qty 3

## 2020-06-10 MED ORDER — LEVALBUTEROL HCL 0.63 MG/3ML IN NEBU: 0.6300 mg | INHALATION_SOLUTION | Freq: Three times a day (TID) | RESPIRATORY_TRACT | Status: DC

## 2020-06-10 MED ORDER — SODIUM CHLORIDE 0.9 % IV SOLN: INTRAVENOUS | Status: DC

## 2020-06-10 MED ORDER — BUDESONIDE 0.25 MG/2ML IN SUSP
0.2500 mg | Freq: Two times a day (BID) | RESPIRATORY_TRACT | Status: DC
  Administered 2020-06-10 – 2020-06-12 (×5): 0.25 mg via RESPIRATORY_TRACT
  Filled 2020-06-10 (×5): qty 2

## 2020-06-10 NOTE — Progress Notes (Signed)
ANTICOAGULATION CONSULT NOTE - Follow Up Consult  Pharmacy Consult for heparin Indication: atrial fibrillation  Allergies  Allergen Reactions  . Penicillin G Rash    Patient Measurements: Height: 5\' 7"  (170.2 cm) Weight: 83.2 kg (183 lb 6.8 oz) IBW/kg (Calculated) : 66.1 Heparin Dosing Weight: 82.7 kg  Vital Signs: Temp: 97.6 F (36.4 C) (12/11 2022) Temp Source: Oral (12/11 2022) BP: 118/76 (12/11 2022) Pulse Rate: 92 (12/11 2022)  Labs: Recent Labs    06/09/20 1633 06/09/20 1635 06/09/20 1832 06/10/20 0034 06/10/20 0952 06/10/20 1503 06/10/20 2049  HGB 10.7*  --   --  10.6*  --   --   --   HCT 32.7*  --   --  31.4*  --   --   --   PLT 301  --   --  290  --   --   --   LABPROT 19.9*  --   --   --   --   --   --   INR 1.8*  --   --   --   --   --   --   HEPARINUNFRC  --   --   --  0.11* 0.24*  --  0.26*  CREATININE 2.11*  --   --  2.04*  --   --   --   CKTOTAL  --   --   --   --   --  42*  --   TROPONINIHS  --  40* 39*  --   --   --   --     Estimated Creatinine Clearance: 28.8 mL/min (A) (by C-G formula based on SCr of 2.04 mg/dL (H)).   Medications:  Medications Prior to Admission  Medication Sig Dispense Refill Last Dose  . albuterol (PROVENTIL) (2.5 MG/3ML) 0.083% nebulizer solution Take 3 mLs (2.5 mg total) by nebulization every 6 (six) hours as needed for wheezing or shortness of breath. 75 mL 1 06/09/2020 at Unknown time  . Budeson-Glycopyrrol-Formoterol (BREZTRI AEROSPHERE) 160-9-4.8 MCG/ACT AERO Inhale 2 puffs into the lungs in the morning and at bedtime. 10.7 g 1 06/09/2020 at Unknown time  . busPIRone (BUSPAR) 5 MG tablet Take 1 tablet (5 mg total) by mouth 3 (three) times daily. 90 tablet 1 06/09/2020 at Unknown time  . Cyanocobalamin (VITAMIN B 12 PO) Take 2 capsules by mouth in the morning and at bedtime.    06/09/2020 at Unknown time  . glucosamine-chondroitin 500-400 MG tablet Take 1 tablet by mouth in the morning and at bedtime.   06/09/2020 at  Unknown time  . Multiple Vitamins-Minerals (ICAPS AREDS 2 PO) Take 1 capsule by mouth in the morning and at bedtime.   06/09/2020 at Unknown time  . Omega-3 Fatty Acids (FISH OIL PO) Take 1 capsule by mouth 2 (two) times daily.    06/09/2020 at Unknown time  . VITAMIN D PO Take 2 tablets by mouth daily.   06/09/2020 at Unknown time  . Budeson-Glycopyrrol-Formoterol (BREZTRI AEROSPHERE) 160-9-4.8 MCG/ACT AERO Inhale 1 puff into the lungs in the morning and at bedtime. (Patient not taking: No sig reported) 4.8 g 0 Not Taking at Unknown time  . HYDROcodone-homatropine (HYCODAN) 5-1.5 MG/5ML syrup Take 5 mLs by mouth every 6 (six) hours as needed for cough. 240 mL 0   . sodium chloride HYPERTONIC 3 % nebulizer solution Take by nebulization 2 (two) times daily as needed for other. (Patient not taking: No sig reported) 750 mL 1 Not Taking at Unknown  time   Scheduled:  . budesonide (PULMICORT) nebulizer solution  0.25 mg Nebulization BID  . levalbuterol  0.63 mg Nebulization Q6H  . predniSONE  60 mg Oral Q breakfast   Infusions:  . azithromycin 500 mg (06/10/20 2038)  . cefTRIAXone (ROCEPHIN)  IV 2 g (06/10/20 1834)  . diltiazem (CARDIZEM) infusion 5 mg/hr (06/10/20 0930)  . heparin 1,500 Units/hr (06/10/20 1208)   PRN: albuterol, guaiFENesin-dextromethorphan Anti-infectives (From admission, onward)   Start     Dose/Rate Route Frequency Ordered Stop   06/10/20 1800  cefTRIAXone (ROCEPHIN) 2 g in sodium chloride 0.9 % 100 mL IVPB        2 g 200 mL/hr over 30 Minutes Intravenous Every 24 hours 06/10/20 0003 06/15/20 1759   06/10/20 1800  azithromycin (ZITHROMAX) 500 mg in sodium chloride 0.9 % 250 mL IVPB        500 mg 250 mL/hr over 60 Minutes Intravenous Every 24 hours 06/10/20 0003 06/15/20 1759   06/09/20 1800  cefTRIAXone (ROCEPHIN) 1 g in sodium chloride 0.9 % 100 mL IVPB        1 g 200 mL/hr over 30 Minutes Intravenous  Once 06/09/20 1757 06/09/20 1906   06/09/20 1800  azithromycin  (ZITHROMAX) 500 mg in sodium chloride 0.9 % 250 mL IVPB        500 mg 250 mL/hr over 60 Minutes Intravenous  Once 06/09/20 1757 06/09/20 1924      Assessment: 84 yo male presents to the ED and was found to be in new onset atrial fibrillation with RVR. PTA the patient is not on anticoagulation. Pharmacy is consulted to dose heparin.  -heparin level up to 0.26 on 1500 units/hr   Goal of Therapy:  Heparin level 0.3-0.7 units/ml Monitor platelets by anticoagulation protocol: Yes   Plan:  Increase heparin IV to 1650 units/hour Recheck heparin level and CBC in the morning Monitor for signs and symptoms of bleeding  Harland German, PharmD Clinical Pharmacist **Pharmacist phone directory can now be found on [email or web address] ([email or web address]).  Listed under [facility].

## 2020-06-10 NOTE — Progress Notes (Signed)
ANTICOAGULATION CONSULT NOTE - Initial Consult  Pharmacy Consult for heparin Indication: atrial fibrillation  Assessment: 71 YOM with Afib RVR to start IV heparin for primary stroke prevention. Patient states that he is not on any anticoagulants at home.   Initial heparin level is 0.11 units/ml  Goal of Therapy:  Heparin level 0.3-0.7 units/ml Monitor platelets by anticoagulation protocol: Yes   Plan:  -Heparin 2000 units IV bolus and increase IV heparin infusion to 1400 units/hr -F/u 8 hr HL -Monitor daily HL, CBC and s/s of bleeding  Thanks for allowing pharmacy to be a part of this patient's care.  Talbert Cage, PharmD Clinical Pharmacist

## 2020-06-10 NOTE — Progress Notes (Signed)
  Echocardiogram 2D Echocardiogram has been performed.  Gerda Diss 06/10/2020, 1:48 PM

## 2020-06-10 NOTE — Progress Notes (Signed)
ANTICOAGULATION CONSULT NOTE - Follow Up Consult  Pharmacy Consult for heparin Indication: atrial fibrillation  Allergies  Allergen Reactions  . Penicillin G Rash    Patient Measurements: Height: 5\' 7"  (170.2 cm) Weight: 83.2 kg (183 lb 6.8 oz) IBW/kg (Calculated) : 66.1 Heparin Dosing Weight: 82.7 kg  Vital Signs: Temp: 97.9 F (36.6 C) (12/11 0007) Temp Source: Oral (12/11 0007) BP: 118/61 (12/11 0916) Pulse Rate: 78 (12/11 0500)  Labs: Recent Labs    06/09/20 1633 06/09/20 1635 06/09/20 1832 06/10/20 0034  HGB 10.7*  --   --  10.6*  HCT 32.7*  --   --  31.4*  PLT 301  --   --  290  LABPROT 19.9*  --   --   --   INR 1.8*  --   --   --   HEPARINUNFRC  --   --   --  0.11*  CREATININE 2.11*  --   --  2.04*  TROPONINIHS  --  40* 39*  --     Estimated Creatinine Clearance: 28.8 mL/min (A) (by C-G formula based on SCr of 2.04 mg/dL (H)).   Medications:  Medications Prior to Admission  Medication Sig Dispense Refill Last Dose  . albuterol (PROVENTIL) (2.5 MG/3ML) 0.083% nebulizer solution Take 3 mLs (2.5 mg total) by nebulization every 6 (six) hours as needed for wheezing or shortness of breath. 75 mL 1 06/09/2020 at Unknown time  . Budeson-Glycopyrrol-Formoterol (BREZTRI AEROSPHERE) 160-9-4.8 MCG/ACT AERO Inhale 2 puffs into the lungs in the morning and at bedtime. 10.7 g 1 06/09/2020 at Unknown time  . busPIRone (BUSPAR) 5 MG tablet Take 1 tablet (5 mg total) by mouth 3 (three) times daily. 90 tablet 1 06/09/2020 at Unknown time  . Cyanocobalamin (VITAMIN B 12 PO) Take 2 capsules by mouth in the morning and at bedtime.    06/09/2020 at Unknown time  . glucosamine-chondroitin 500-400 MG tablet Take 1 tablet by mouth in the morning and at bedtime.   06/09/2020 at Unknown time  . Multiple Vitamins-Minerals (ICAPS AREDS 2 PO) Take 1 capsule by mouth in the morning and at bedtime.   06/09/2020 at Unknown time  . Omega-3 Fatty Acids (FISH OIL PO) Take 1 capsule by mouth 2  (two) times daily.    06/09/2020 at Unknown time  . VITAMIN D PO Take 2 tablets by mouth daily.   06/09/2020 at Unknown time  . Budeson-Glycopyrrol-Formoterol (BREZTRI AEROSPHERE) 160-9-4.8 MCG/ACT AERO Inhale 1 puff into the lungs in the morning and at bedtime. (Patient not taking: No sig reported) 4.8 g 0 Not Taking at Unknown time  . HYDROcodone-homatropine (HYCODAN) 5-1.5 MG/5ML syrup Take 5 mLs by mouth every 6 (six) hours as needed for cough. 240 mL 0   . sodium chloride HYPERTONIC 3 % nebulizer solution Take by nebulization 2 (two) times daily as needed for other. (Patient not taking: No sig reported) 750 mL 1 Not Taking at Unknown time   Scheduled:   Infusions:  . sodium chloride 100 mL/hr at 06/10/20 0100  . azithromycin    . cefTRIAXone (ROCEPHIN)  IV    . diltiazem (CARDIZEM) infusion 5 mg/hr (06/10/20 0930)  . heparin 1,400 Units/hr (06/10/20 0821)   PRN: albuterol, guaiFENesin-dextromethorphan Anti-infectives (From admission, onward)   Start     Dose/Rate Route Frequency Ordered Stop   06/10/20 1800  cefTRIAXone (ROCEPHIN) 2 g in sodium chloride 0.9 % 100 mL IVPB        2 g 200 mL/hr over  30 Minutes Intravenous Every 24 hours 06/10/20 0003 06/15/20 1759   06/10/20 1800  azithromycin (ZITHROMAX) 500 mg in sodium chloride 0.9 % 250 mL IVPB        500 mg 250 mL/hr over 60 Minutes Intravenous Every 24 hours 06/10/20 0003 06/15/20 1759   06/09/20 1800  cefTRIAXone (ROCEPHIN) 1 g in sodium chloride 0.9 % 100 mL IVPB        1 g 200 mL/hr over 30 Minutes Intravenous  Once 06/09/20 1757 06/09/20 1906   06/09/20 1800  azithromycin (ZITHROMAX) 500 mg in sodium chloride 0.9 % 250 mL IVPB        500 mg 250 mL/hr over 60 Minutes Intravenous  Once 06/09/20 1757 06/09/20 1924      Assessment: 84 yo male presents to the ED and was found to be in new onset atrial fibrillation with RVR. PTA the patient is not on anticoagulation. Pharmacy is consulted to dose heparin.   Heparin level is  slightly subtherapeutic at 0.24 while running at 1400 units/hour and after a 2000 unit bolus. Per the RN, there were no issues with the infusion and the patient is without signs or symptoms of bleeding. CBC is stable currently with Hgb 10.6, platelets 290.  Goal of Therapy:  Heparin level 0.3-0.7 units/ml Monitor platelets by anticoagulation protocol: Yes   Plan:  Increase heparin IV to 1500 units/hour Obtain 8-hour heparin level  Monitor daily heparin level and CBC Monitor for signs and symptoms of bleeding   Sanda Klein, PharmD, RPh  PGY-1 Pharmacy Resident 06/10/2020 10:37 AM  Please check [email or web address] for unit-specific pharmacy phone numbers.

## 2020-06-10 NOTE — Progress Notes (Signed)
Wheezing as documented without delay of chest wall movement, no prolonged expiratory phase of difficulty on inspiration.  Tones more monophonic in nature on spectrum.  Non responsive to bronchodilator therapy.

## 2020-06-10 NOTE — Consult Note (Signed)
CARDIOLOGY CONSULT NOTE       Patient ID: Justin Mccarty MRN: [id number] DOB/AGE: 82/13/1937 82 y.o.  Admit date: 06/09/2020 Referring Physician: Donna Carrol/Tuan Tippin [place] Primary Physician: Ephriam Jenkins, PA Primary Cardiologist: Allred Reason for Consultation: AFib  Principal Problem:   New onset a-fib [facility]) Active Problems:   Community acquired pneumonia   Hyponatremia   AKI (acute kidney injury) ([place])   Transaminitis   Sepsis ([place])   HPI:  84 y.o. admitted with respiratory failure and low sats. Had irregular pulse. History of ? PAF Seen by Malon Kindle in afib clinic in May with SR PaCls Outpatient monitor 12/29/19 with no afib frequent PaC/PVC non sustained atrial tachycardia. He has known ILD Not on nebs or oxygen at home. However he has gone down hill a lot last 2-3 weeks Seen by Dr Ander Slade 06/06/20 [facility] complained of more cough. He does not appear to have vasodilator response ANA/ESR negatrive Non smoker PFTls with moderate restriction Nebulizer prescribed but has not gotten. ECG in hospital with afib rate 123 nonspecific ST changes. He has a very distant history of gastric ulcer but no contraindications to anticoagulation. "up until a few weeks ago he was like a teenager" his wife indicates Currently with cardizem rates are well controlled 80-95 bpm. TTE about to be done at beside   ROS All other systems reviewed and negative except as noted above  Past Medical History:  Diagnosis Date  . Acid reflux disease   . Acute blood loss anemia    Postoperative  . Cellulitis    History of Cellulitis  . History of sepsis   . Hypercholesterolemia    Mild  . Hyponatremia       . 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 Transfusion history    Status post transfusion without sequelae    Family History  Problem Relation Age of Onset  . Bone cancer Mother   . Colon cancer Sister     Social History   Socioeconomic History  . Marital status: Married    Spouse  name: Not on file  . Number of children: Not on file  . Years of education: Not on file  . Highest education level: Not on file  Occupational History  . Not on file  Tobacco Use  . Smoking status: Never Smoker  . Smokeless tobacco: Never Used  Substance and Sexual Activity  . Alcohol use: No  . Drug use: Not on file  . Sexual activity: Not on file  Other Topics Concern  . Not on file  Social History Narrative  . Not on file   Social Determinants of Health   Financial Resource Strain: Not on file  Food Insecurity: Not on file  Transportation Needs: Not on file  Physical Activity: Not on file  Stress: Not on file  Social Connections: Not on file  Intimate Partner Violence: Not on file    Past Surgical History:  Procedure Laterality Date  . HERNIA REPAIR     Abdominal  . KNEE SURGERY    . TRANSURETHRAL RESECTION OF PROSTATE        Current Facility-Administered Medications:  .  0.9 %  sodium chloride infusion, , Intravenous, Continuous, Garba, Mohammad L, MD, Last Rate: 100 mL/hr at 06/10/20 0100, New Bag at 06/10/20 0100 .  albuterol (PROVENTIL) (2.5 MG/3ML) 0.083% nebulizer solution 2.5 mg, 2.5 mg, Nebulization, Q6H PRN, Shela Leff, MD, 2.5 mg at 06/10/20 1221 .  azithromycin (ZITHROMAX) 500 mg in  sodium chloride 0.9 % 250 mL IVPB, 500 mg, Intravenous, Q24H, Garba, Mohammad L, MD .  cefTRIAXone (ROCEPHIN) 2 g in sodium chloride 0.9 % 100 mL IVPB, 2 g, Intravenous, Q24H, Garba, Mohammad L, MD .  [COMPLETED] diltiazem (CARDIZEM) 1 mg/mL load via infusion 10 mg, 10 mg, Intravenous, Once, 10 mg at 06/09/20 1637 **AND** diltiazem (CARDIZEM) 125 mg in dextrose 5% 125 mL (1 mg/mL) infusion, 5-15 mg/hr, Intravenous, Continuous, Garba, Mohammad L, MD, Last Rate: 5 mL/hr at 06/10/20 0930, 5 mg/hr at 06/10/20 0930 .  guaiFENesin-dextromethorphan (ROBITUSSIN DM) 100-10 MG/5ML syrup 5 mL, 5 mL, Oral, Q4H PRN, Dana Allan I, MD, 5 mL at 06/10/20 0741 .  heparin ADULT  infusion 100 units/mL (25000 units/290m sodium chloride 0.45%), 1,500 Units/hr, Intravenous, Continuous, [facility]arlina Sicilian [facility] Last Rate: 15 mL/hr at 06/10/20 1208, 1,500 Units/hr at 06/10/20 1208  . sodium chloride 100 mL/hr at 06/10/20 0100  . azithromycin    . cefTRIAXone (ROCEPHIN)  IV    . diltiazem (CARDIZEM) infusion 5 mg/hr (06/10/20 0930)  . heparin 1,500 Units/hr (06/10/20 1208)    Physical Exam: Blood pressure 118/61, pulse 78, temperature 97.9 F (36.6 C), temperature source Oral, resp. rate 18, height 5' 7"  (1.702 m), weight 83.2 kg, SpO2 92 %.    Chronically ill white male Diffuse loud rhonchi/wheezing ILD as wel JVP not elevated No obvious murmur but hard to hear  No edema Not volume overloaded   Labs:   Lab Results  Component Value Date   WBC 9.9 06/10/2020   HGB 10.6 (L) 06/10/2020   HCT 31.4 (L) 06/10/2020   MCV 92.9 06/10/2020   PLT 290 06/10/2020    Recent Labs  Lab 06/10/20 0034  NA 129*  K 5.1  CL 98  CO2 21*  BUN 54*  CREATININE 2.04*  CALCIUM 8.6*  PROT 6.6  BILITOT 0.7  ALKPHOS 122  ALT 495*  AST 404*  GLUCOSE 213*   No results found for: CKTOTAL, CKMB, CKMBINDEX, TROPONINI No results found for: CHOL No results found for: HDL No results found for: LDLCALC No results found for: TRIG No results found for: CHOLHDL No results found for: LDLDIRECT    Radiology: DG Chest Port 1 View  Result Date: 06/09/2020 CLINICAL DATA:  Shortness of breath EXAM: PORTABLE CHEST 1 VIEW COMPARISON:  Chest x-ray 11/18/2019, CT chest 04/04/2020 FINDINGS: The heart size and mediastinal contours are unchanged. Aortic arch calcification. Interval development of hazy airspace opacity overlying previously noted coarsened interstitial markings/scarring within the right upper lobe, right middle lobe, lingula, and left lower lobe. Bilateral lower lobe calcified granulomas again noted. No pulmonary edema. Likely trace left pleural effusion. No pneumothorax. No  acute osseous abnormality. IMPRESSION: 1. Interval development of multifocal infection/inflammation. Followup PA and lateral chest X-ray is recommended in 3-4 weeks following therapy to ensure resolution and exclude underlying malignancy. 2. Likely trace left pleural effusion. Electronically Signed   By: MIven FinnM.D.   On: 06/09/2020 16:32    EKG: afib nonspecific ST changes    ASSESSMENT AND PLAN:   1. PaF:  Rate control is fine cover with heparin TTE pending would not start DOAC till seen by pulmonary as he may need bronchoscopy   2. Pulmonary:  Primary driver of arrhythmia with hypoxia CT deemed stable October but CXR on admission with interval development of multifocal infection/inflammation He sounds horrible on exam Needs nebulizer likely course of steroids ? bronchoscopy and inpatient pulmonary consult COVID and Flu negative on admission  SignedJenkins Rouge 06/10/2020, 1:09 PM

## 2020-06-10 NOTE — Consult Note (Signed)
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  He is not O2 dependent prior to this admission.  He is followed in pulmonary critical care clinic by Dr. Sherri Rad was in his usual state of health until approximately 2 weeks ago.  Of note he has been seen for paroxysmal atrial fibrillation in the past but has not required treatment.  He was seen recently in the pulmonary clinic was told he had pulmonary fibrosis for which there was little we could offer.  He had been on albuterol in the past which he says he feels better off of.  He has continued decline and becoming more short of breath with lower extremity edema and was admitted for further evaluation and treatment to [facility].  He was seen by cardiology for his atrial fibrillation 2D echo shows right heart enlargement along with suspected pulmonary hypertension.  Pulmonary was called to the bedside 06/10/2020 for hypoxia decreased pulmonary functions and now with increasing creatinine which makes CT of the chest incompatible at this time. We will evaluate him await him for possible fiberoptic bronchoscopy in the future although there is a likelihood that he would end up on the ventilator with conscious sedation and interventions.  We will continue to follow. PAST MEDICAL HISTORY :   has a past medical history of Acid reflux disease, Acute blood loss anemia, Cellulitis, History of sepsis, Hypercholesterolemia, Hyponatremia, Osteoarthritis, and Transfusion history.  has a past surgical history that includes Knee surgery; Hernia repair;  and Transurethral resection of prostate. Prior to Admission medications   Medication Sig Start Date End Date Taking? Authorizing Provider  albuterol (PROVENTIL) (2.5 MG/3ML) 0.083% nebulizer solution Take 3 mLs (2.5 mg total) by nebulization every 6 (six) hours as needed for wheezing or shortness of breath. 06/06/20  Yes Olalere, Adewale A, MD  Budeson-Glycopyrrol-Formoterol (BREZTRI AEROSPHERE) 160-9-4.8 MCG/ACT AERO Inhale 2 puffs into the lungs in the morning and at bedtime. 04/20/20  Yes Olalere, Adewale A, MD  busPIRone (BUSPAR) 5 MG tablet Take 1 tablet (5 mg total) by mouth 3 (three) times daily. 06/06/20  Yes Olalere, Adewale A, MD  Cyanocobalamin (VITAMIN B 12 PO) Take 2 capsules by mouth in the morning and at bedtime.    Yes [provider]  glucosamine-chondroitin 500-400 MG tablet Take 1 tablet by mouth in the morning and at bedtime.   Yes [provider]  Multiple Vitamins-Minerals (ICAPS AREDS 2 PO) Take 1 capsule by mouth in the morning and at bedtime.   Yes [provider]  Omega-3 Fatty Acids (FISH OIL PO) Take 1 capsule by mouth 2 (two) times daily.    Yes [provider]  VITAMIN D PO Take 2 tablets by mouth daily.   Yes [provider]  Budeson-Glycopyrrol-Formoterol (BREZTRI AEROSPHERE) 160-9-4.8 MCG/ACT AERO Inhale 1 puff into the lungs in the morning and at bedtime. Patient not taking: No sig reported 04/20/20   Virl Diamond A, MD  HYDROcodone-homatropine (HYCODAN) 5-1.5 MG/5ML syrup Take 5 mLs by mouth every 6 (six) hours as needed for cough. 06/06/20  Olalere, Adewale A, MD  sodium chloride HYPERTONIC 3 % nebulizer solution Take by nebulization 2 (two) times daily as needed for other. Patient not taking: No sig reported 06/06/20   Tomma Lightninglalere, Adewale A, MD   Allergies  Allergen Reactions   Penicillin G Rash    FAMILY HISTORY:  family history includes Bone cancer in his mother; Colon cancer in his sister. SOCIAL HISTORY:   reports that he has never smoked. He has never used smokeless tobacco. He reports that he does not drink alcohol.  REVIEW OF SYSTEMS:   10 point review of system taken, please see HPI for positives and negatives.   SUBJECTIVE:  No acute distress at rest VITAL SIGNS: Temp:  [97.5 F (36.4 C)-97.9 F (36.6 C)] 97.9 F (36.6 C) (12/11 0007) Pulse Rate:  [33-103] 73 (12/11 0800) Resp:  [16-31] 18 (12/11 0917) BP: (101-139)/(61-101) 118/61 (12/11 0916) SpO2:  [88 %-100 %] 92 % (12/11 1222) Weight:  [83 kg-83.2 kg] 83.2 kg (12/11 0007)  PHYSICAL EXAMINATION: General: Elderly male no acute distress at rest Neuro: Grossly intact without focal defect HEENT: Appears to have a component of vocal cord dysfunction Cardiovascular: Heart sounds are irregular Lungs: Coarse rhonchi and decreased air movement Abdomen: Soft nontender Musculoskeletal: Grossly intact Skin: Warm and dry  Recent Labs  Lab 06/09/20 1633 06/10/20 0034  NA 132* 129*  K 4.9 5.1  CL 99 98  CO2 20* 21*  BUN 54* 54*  CREATININE 2.11* 2.04*  GLUCOSE 143* 213*   Recent Labs  Lab 06/09/20 1633 06/10/20 0034  HGB 10.7* 10.6*  HCT 32.7* 31.4*  WBC 10.1 9.9  PLT 301 290   DG Chest Port 1 View  Result Date: 06/09/2020 CLINICAL DATA:  Shortness of breath EXAM: PORTABLE CHEST 1 VIEW COMPARISON:  Chest x-ray 11/18/2019, CT chest 04/04/2020 FINDINGS: The heart size and mediastinal contours are unchanged. Aortic arch calcification. Interval development of hazy airspace opacity overlying previously noted coarsened interstitial markings/scarring within the right upper lobe, right middle lobe, lingula, and left lower lobe. Bilateral lower lobe calcified granulomas again noted. No pulmonary edema. Likely trace left pleural effusion. No pneumothorax. No acute osseous abnormality. IMPRESSION: 1. Interval development of multifocal infection/inflammation. Followup PA and lateral chest X-ray is recommended in 3-4 weeks following  therapy to ensure resolution and exclude underlying malignancy. 2. Likely trace left pleural effusion. Electronically Signed   By: Tish FredericksonMorgane  Naveau M.D.   On: 06/09/2020 16:32   ECHOCARDIOGRAM COMPLETE  Result Date: 06/10/2020    ECHOCARDIOGRAM REPORT   Patient Name:   Dorothyann PengARL W Lippold Date of Exam: 06/10/2020 Medical Rec #:  [id number]     Height:       67.0 in Accession #:    [id number]    Weight:       183.4 lb Date of Birth:  04/10/1936      BSA:          1.949 m Patient Age:    84 years      BP:           118/61 mmHg Patient Gender: M             HR:           78 bpm. Exam Location:  Inpatient Procedure: 2D Echo, Cardiac Doppler and Color Doppler Indications:    Atrial fibrillation  History:        Patient has no prior history of Echocardiogram examinations.  Arrythmias:Atrial Fibrillation.  Sonographer:    Ross Ludwig RDCS (AE) Referring Phys: [id number] [facility] Jerelyn Charles  Sonographer Comments: Image acquisition challenging due to respiratory motion. IMPRESSIONS  1. Left ventricular ejection fraction, by estimation, is 45 to 50%. The left ventricle has mildly decreased function. The left ventricle demonstrates global hypokinesis. There is moderate concentric left ventricular hypertrophy. Left ventricular diastolic function could not be evaluated. There is the interventricular septum is flattened in systole and diastole, consistent with right ventricular pressure and volume overload.  2. Right ventricular systolic function is moderately reduced. The right ventricular size is moderately enlarged. There is moderately elevated pulmonary artery systolic pressure. The estimated right ventricular systolic pressure is 47.7 mmHg.  3. Left atrial size was mildly dilated.  4. Right atrial size was moderately dilated.  5. The mitral valve is normal in structure. No evidence of mitral valve regurgitation. No evidence of mitral stenosis.  6. The aortic valve is normal in structure. Aortic valve regurgitation is mild.  Mild to moderate aortic valve sclerosis/calcification is present, without any evidence of aortic stenosis.  7. The inferior vena cava is dilated in size with <50% respiratory variability, suggesting right atrial pressure of 15 mmHg. FINDINGS  Left Ventricle: Left ventricular ejection fraction, by estimation, is 45 to 50%. The left ventricle has mildly decreased function. The left ventricle demonstrates global hypokinesis. The left ventricular internal cavity size was normal in size. There is  moderate concentric left ventricular hypertrophy. The interventricular septum is flattened in systole and diastole, consistent with right ventricular pressure and volume overload. Left ventricular diastolic function could not be evaluated due to atrial fibrillation. Left ventricular diastolic function could not be evaluated. Right Ventricle: The right ventricular size is moderately enlarged. No increase in right ventricular wall thickness. Right ventricular systolic function is moderately reduced. There is moderately elevated pulmonary artery systolic pressure. The tricuspid  regurgitant velocity is 2.86 m/s, and with an assumed right atrial pressure of 15 mmHg, the estimated right ventricular systolic pressure is 47.7 mmHg. Left Atrium: Left atrial size was mildly dilated. Right Atrium: Right atrial size was moderately dilated. Pericardium: There is no evidence of pericardial effusion. Mitral Valve: The mitral valve is normal in structure. There is mild thickening of the mitral valve leaflet(s). There is mild calcification of the mitral valve leaflet(s). Mild mitral annular calcification. No evidence of mitral valve regurgitation. No evidence of mitral valve stenosis. Tricuspid Valve: The tricuspid valve is normal in structure. Tricuspid valve regurgitation is mild . No evidence of tricuspid stenosis. Aortic Valve: The aortic valve is normal in structure. Aortic valve regurgitation is mild. Aortic regurgitation PHT measures  988 msec. Mild to moderate aortic valve sclerosis/calcification is present, without any evidence of aortic stenosis. Aortic valve  mean gradient measures 2.0 mmHg. Aortic valve peak gradient measures 3.6 mmHg. Aortic valve area, by VTI measures 2.88 cm. Pulmonic Valve: The pulmonic valve was normal in structure. Pulmonic valve regurgitation is not visualized. No evidence of pulmonic stenosis. Aorta: The aortic root is normal in size and structure. Venous: The inferior vena cava is dilated in size with less than 50% respiratory variability, suggesting right atrial pressure of 15 mmHg. IAS/Shunts: No atrial level shunt detected by color flow Doppler.  LEFT VENTRICLE PLAX 2D LVIDd:         5.20 cm LVIDs:         4.00 cm LV PW:         1.30 cm LV IVS:  1.40 cm LVOT diam:     2.00 cm LV SV:         46 LV SV Index:   24 LVOT Area:     3.14 cm  RIGHT VENTRICLE            IVC RV Basal diam:  3.30 cm    IVC diam: 2.50 cm RV S prime:     8.79 cm/s TAPSE (M-mode): 1.2 cm LEFT ATRIUM             Index       RIGHT ATRIUM           Index LA diam:        4.10 cm 2.10 cm/m  RA Area:     17.80 cm LA Vol (A2C):   46.2 ml 23.70 ml/m RA Volume:   43.40 ml  22.27 ml/m LA Vol (A4C):   61.1 ml 31.35 ml/m LA Biplane Vol: 58.8 ml 30.17 ml/m  AORTIC VALVE AV Area (Vmax):    2.62 cm AV Area (Vmean):   2.49 cm AV Area (VTI):     2.88 cm AV Vmax:           95.18 cm/s AV Vmean:          70.025 cm/s AV VTI:            0.161 m AV Peak Grad:      3.6 mmHg AV Mean Grad:      2.0 mmHg LVOT Vmax:         79.48 cm/s LVOT Vmean:        55.500 cm/s LVOT VTI:          0.148 m LVOT/AV VTI ratio: 0.92 AI PHT:            988 msec  AORTA Ao Root diam: 3.50 cm Ao Asc diam:  3.30 cm TRICUSPID VALVE TR Peak grad:   32.7 mmHg TR Vmax:        286.00 cm/s  SHUNTS Systemic VTI:  0.15 m Systemic Diam: 2.00 cm Tobias Alexander MD Electronically signed by Tobias Alexander MD Signature Date/Time: 06/10/2020/1:51:20 PM    Final     ASSESSMENT:Principal  Problem:   New onset a-fib [facility]) Active Problems:   Community acquired pneumonia   Hyponatremia   AKI (acute kidney injury) (HCC)   Transaminitis   Sepsis [facility])  Discussion: 84 year old never smoker, 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  He is not O2 dependent prior to this admission.  He is followed in pulmonary critical care clinic by Dr. Sherri Rad was in his usual state of health until approximately 2 weeks ago.  Of note he has been seen for paroxysmal atrial fibrillation in the past but has not required treatment.  He was seen recently in the pulmonary clinic was told he had pulmonary fibrosis for which there was little we could offer.  He had been on albuterol in the past which he says he feels better off of.  He has continued decline and becoming more short of breath with lower extremity edema and was admitted for further evaluation and treatment to [facility].  He was seen by cardiology for his atrial fibrillation 2D echo shows right heart enlargement along with suspected pulmonary hypertension.  Pulmonary was called to the bedside 06/10/2020 for hypoxia decreased pulmonary functions and now with increasing creatinine which makes CT of the chest incompatible at this time. We  will evaluate him await him for possible fiberoptic bronchoscopy in the future although there is a likelihood that he would end up on the ventilator with conscious sedation and interventions.  We will continue to follow.      PLAN:  Oxygen as needed keep sats greater than 88% Flutter valve and incentive spirometer to help with pulmonary toilet He is currently on steroids although suspect component of his wheezing is from vocal cord dysfunction As needed nebulizers as needed Questionable fiberoptic bronchoscopy for further evaluation of his pulmonary functions.  Brett Canales Sabastion Hrdlicka ACNP Acute Care Nurse Practitioner [facility] Pulmonary/Critical Care Please consult  Amion 06/10/2020, 2:08 PM

## 2020-06-10 NOTE — Progress Notes (Signed)
PROGRESS NOTE    Justin Mccarty  ZOX:[id number] DOB: March 01, 1936 DOA: 06/09/2020 PCP: Justin Mocha, PA  Outpatient Specialists:   Brief Narrative:  As per H&P: "Justin Mccarty is a 84 y.o. male with medical history significant of pulmonary fibrosis, GERD, hyponatremia and acute blood loss anemia who was sent over to the ER by his pulmonologist after visit today showing oxygen sats 78% on room air.  He has had progressive worsening of his symptoms.  In the last 2 weeks he has gotten worse with cough increase pain and now he has oxygen requirement which is new.  During that visit also he was noted to have irregular rhythm.  He was placed on 6 L of oxygen and sent over to the ER.  He is now about 90% on 2 L.  His initial oxygen sat in the ER was 89%.  Patient found to be in A. fib with RVR.  No prior documented history of A. fib.  He is therefore being admitted with new onset A. fib with RVR and worsening hypoxemia.  His chest x-ray indicated right lung pneumonia but has no history of dysphagia and denied any choking.  No evidence that he may have had aspiration..  ED Course: Temperature 97.9, blood pressure 113/88, pulse 103 respiratory of 30 oxygen sats 88% on room air currently 96% 3 L.  Sodium 132 potassium 4.9 chloride 99 CO2 20 glucose 143 BUN 53 creatinine 2.11 calcium 8.6 gap of 13 magnesium 2.3.  Alkaline phosphatase 135 albumin 2.5 AST 663 ALT 551 total protein 6.5.  BNP is 745.  Troponin is 40 and then 39.  White count 10.1 hemoglobin 10.7 platelets 301, PT 13.9 INR 1.8 TSH 2.645.  Acute respiratory panel is negative.  Chest x-ray shows interval development of multifocal infection.  Likely trace left pleural effusion also.  Findings in the right upper lobe right middle lobe lingula and left lower lobe.  He will be admitted for treatment of right more than left pneumonia with A. fib with RVR".  -Cardiology and pulmonary team consulted. -Echocardiogram revealed: 1. Left ventricular ejection  fraction, by estimation, is 45 to 50%. The  left ventricle has mildly decreased function. The left ventricle  demonstrates global hypokinesis. There is moderate concentric left  ventricular hypertrophy. Left ventricular  diastolic function could not be evaluated. There is the interventricular  septum is flattened in systole and diastole, consistent with right  ventricular pressure and volume overload.  2. Right ventricular systolic function is moderately reduced. The right  ventricular size is moderately enlarged. There is moderately elevated  pulmonary artery systolic pressure. The estimated right ventricular  systolic pressure is 47.7 mmHg.  3. Left atrial size was mildly dilated.  4. Right atrial size was moderately dilated.  5. The mitral valve is normal in structure. No evidence of mitral valve  regurgitation. No evidence of mitral stenosis.  6. The aortic valve is normal in structure. Aortic valve regurgitation is  mild. Mild to moderate aortic valve sclerosis/calcification is present,  without any evidence of aortic stenosis.  7. The inferior vena cava is dilated in size with <50% respiratory  variability, suggesting right atrial pressure of 15 mmHg.   -IV fluids discontinued. -Patient is started on prednisone, Pulmicort and Xopenex. -Pulmonary team has ordered IV Lasix 40 Mg x1 dose, and kept patient n.p.o. for possible bronchoscopy tomorrow. -Patient seen alongside patient's wife.  Patient has audible wheezing.  Patient was not on oxygen prior to presentation.  Prior history  of A. fib noted, no started on anticoagulation by cardiology.  Remote history of GI bleed reported. -Patient remains in A. fib, but controlled.  Assessment & Plan:   Principal Problem:   New onset a-fib [facility]) Active Problems:   Community acquired pneumonia   Hyponatremia   AKI (acute kidney injury) (HCC)   Transaminitis   Sepsis (HCC)     A. fib with RVR:  -Apparently, patient has had a  prior history of atrial fibrillation. -Patient is known to the cardiology team. -Cardiology team has been consulted. -Heart rate is controlled.  However, patient remains in atrial fibrillation. -Patient is currently on IV Cardizem and heparin drip. -Echocardiogram is pending.   -Cardiology team is directing care.    Possible multifocal pneumonia:  -Patient is currently on azithromycin and Rocephin. -Elevated procalcitonin level. -Pulmonary team has been consulted.   Pulmonary fibrosis:  -Pulmonary team consulted.   -IV fluid on hold. -Start prednisone 60 Mg p.o. once daily, Pulmicort, Xopenex.  Continue to use albuterol as needed.    Acute kidney injury:  -Last documented serum creatinine was in 2010. -Suspect that the kidney injury may actually be chronic versus acute kidney injury on chronic kidney disease. -Renal ultrasound. -Check UPC, urine sediment ANA. -Further management will depend on above. -We will have low threshold to consult nephrology team.       Sepsis (as per admitting provider):   Hyponatremia:  -Check urine sodium, urine osmolality, serum osmolality. -We will also check cortisol (potassium is at the upper limits of normal) and TSH.    Transaminitis:  -Elevated AST and ALT. -Albumin is also low. -Cannot rule out congestive hepatopathy. -Hopefully, renal ultrasound will also come into the liver morphology. -Check acute hepatitis profile.    DVT prophylaxis: Heparin drip. Code Status: Full code Family Communication: Wife Disposition Plan: This will depend on hospital course  Consultants:   Cardiology  Pulmonary  We will have a low threshold to consult nephrology.  Procedures:   Echocardiogram  Antimicrobials:   Azithromycin  Rocephin   Subjective: Shortness of breath. Audible wheeze  Objective: Vitals:   06/10/20 0916 06/10/20 0917 06/10/20 0930 06/10/20 1222  BP: 118/61     Pulse:      Resp:  18    Temp:      TempSrc:       SpO2:   94% 92%  Weight:      Height:        Intake/Output Summary (Last 24 hours) at 06/10/2020 1229 Last data filed at 06/09/2020 1906 Gross per 24 hour  Intake 100 ml  Output --  Net 100 ml   Filed Weights   06/09/20 1554 06/10/20 0007  Weight: 83 kg 83.2 kg    Examination:  General exam: Appears acutely ill looking.  Audible wheeze. Respiratory system: Decreased air entry.  Inspiratory and expiratory wheeze. Cardiovascular system: S1 & S2, irregularly irregular.  Gastrointestinal system: Abdomen is nondistended, soft and nontender.  Organs are difficult to assess.   Central nervous system: Alert and oriented.  Patient moves all extremities.   Extremities: Bilateral lower leg edema.  Data Reviewed: I have personally reviewed following labs and imaging studies  CBC: Recent Labs  Lab 06/09/20 1633 06/10/20 0034  WBC 10.1 9.9  NEUTROABS 8.4* 8.9*  HGB 10.7* 10.6*  HCT 32.7* 31.4*  MCV 94.8 92.9  PLT 301 290   Basic Metabolic Panel: Recent Labs  Lab 06/09/20 1633 06/10/20 0034  NA 132* 129*  K 4.9 5.1  CL 99 98  CO2 20* 21*  GLUCOSE 143* 213*  BUN 54* 54*  CREATININE 2.11* 2.04*  CALCIUM 8.6* 8.6*  MG 2.3  --    GFR: Estimated Creatinine Clearance: 28.8 mL/min (A) (by C-G formula based on SCr of 2.04 mg/dL (H)). Liver Function Tests: Recent Labs  Lab 06/09/20 1633 06/10/20 0034  AST 663* 404*  ALT 551* 495*  ALKPHOS 135* 122  BILITOT 0.9 0.7  PROT 6.5 6.6  ALBUMIN 2.5* 2.5*   No results for input(s): LIPASE, AMYLASE in the last 168 hours. No results for input(s): AMMONIA in the last 168 hours. Coagulation Profile: Recent Labs  Lab 06/09/20 1633  INR 1.8*   Cardiac Enzymes: No results for input(s): CKTOTAL, CKMB, CKMBINDEX, TROPONINI in the last 168 hours. BNP (last 3 results) No results for input(s): PROBNP in the last 8760 hours. HbA1C: No results for input(s): HGBA1C in the last 72 hours. CBG: No results for input(s): GLUCAP in  the last 168 hours. Lipid Profile: No results for input(s): CHOL, HDL, LDLCALC, TRIG, CHOLHDL, LDLDIRECT in the last 72 hours. Thyroid Function Tests: Recent Labs    06/09/20 1634  TSH 2.645   Anemia Panel: No results for input(s): VITAMINB12, FOLATE, FERRITIN, TIBC, IRON, RETICCTPCT in the last 72 hours. Urine analysis:    Component Value Date/Time   COLORURINE YELLOW 06/09/2020 2331   APPEARANCEUR CLEAR 06/09/2020 2331   LABSPEC 1.018 06/09/2020 2331   PHURINE 5.0 06/09/2020 2331   GLUCOSEU NEGATIVE 06/09/2020 2331   HGBUR NEGATIVE 06/09/2020 2331   BILIRUBINUR NEGATIVE 06/09/2020 2331   KETONESUR NEGATIVE 06/09/2020 2331   PROTEINUR NEGATIVE 06/09/2020 2331   UROBILINOGEN 0.2 09/13/2008 0820   NITRITE NEGATIVE 06/09/2020 2331   LEUKOCYTESUR NEGATIVE 06/09/2020 2331   Sepsis Labs: @LABRCNTIP (procalcitonin:4,lacticidven:4)  ) Recent Results (from the past 240 hour(s))  Resp Panel by RT-PCR (Flu A&B, Covid) Nasopharyngeal Swab     Status: None   Collection Time: 06/09/20  9:25 PM   Specimen: Nasopharyngeal Swab; Nasopharyngeal(NP) swabs in vial transport medium  Result Value Ref Range Status   SARS Coronavirus 2 by RT PCR NEGATIVE NEGATIVE Final    Comment: (NOTE) SARS-CoV-2 target nucleic acids are NOT DETECTED.  The SARS-CoV-2 RNA is generally detectable in upper respiratory specimens during the acute phase of infection. The lowest concentration of SARS-CoV-2 viral copies this assay can detect is 138 copies/mL. A negative result does not preclude SARS-Cov-2 infection and should not be used as the sole basis for treatment or other patient management decisions. A negative result may occur with  improper specimen collection/handling, submission of specimen other than nasopharyngeal swab, presence of viral mutation(s) within the areas targeted by this assay, and inadequate number of viral copies(<138 copies/mL). A negative result must be combined with clinical  observations, patient history, and epidemiological information. The expected result is Negative.  Fact Sheet for Patients:  BloggerCourse.comhttps://www.fda.gov/media/152166/download  Fact Sheet for Healthcare Providers:  SeriousBroker.ithttps://www.fda.gov/media/152162/download  This test is no t yet approved or cleared by the [place] FDA and  has been authorized for detection and/or diagnosis of SARS-CoV-2 by FDA under an Emergency Use Authorization (EUA). This EUA will remain  in effect (meaning this test can be used) for the duration of the COVID-19 declaration under Section 564(b)(1) of the Act, 21 U.S.C.section 360bbb-3(b)(1), unless the authorization is terminated  or revoked sooner.       Influenza A by PCR NEGATIVE NEGATIVE Final   Influenza B by PCR NEGATIVE NEGATIVE Final  Comment: (NOTE) The Xpert Xpress SARS-CoV-2/FLU/RSV plus assay is intended as an aid in the diagnosis of influenza from Nasopharyngeal swab specimens and should not be used as a sole basis for treatment. Nasal washings and aspirates are unacceptable for Xpert Xpress SARS-CoV-2/FLU/RSV testing.  Fact Sheet for Patients: BloggerCourse.com  Fact Sheet for Healthcare Providers: SeriousBroker.it  This test is not yet approved or cleared by the [place] FDA and has been authorized for detection and/or diagnosis of SARS-CoV-2 by FDA under an Emergency Use Authorization (EUA). This EUA will remain in effect (meaning this test can be used) for the duration of the COVID-19 declaration under Section 564(b)(1) of the Act, 21 U.S.C. section 360bbb-3(b)(1), unless the authorization is terminated or revoked.  Performed at [facility], [address] [place], [place] [address]   Culture, blood (routine x 2) Call MD if unable to obtain prior to antibiotics being given     Status: None (Preliminary result)   Collection Time: 06/10/20 12:34 AM   Specimen: BLOOD RIGHT HAND   Result Value Ref Range Status   Specimen Description BLOOD RIGHT HAND  Final   Special Requests AEROBIC BOTTLE ONLY Blood Culture adequate volume  Final   Culture   Final    NO GROWTH < 12 HOURS Performed at [facility], [address], [place], [place] [address]    Report Status PENDING  Incomplete  Culture, blood (routine x 2) Call MD if unable to obtain prior to antibiotics being given     Status: None (Preliminary result)   Collection Time: 06/10/20 12:54 AM   Specimen: BLOOD LEFT HAND  Result Value Ref Range Status   Specimen Description BLOOD LEFT HAND  Final   Special Requests   Final    BOTTLES DRAWN AEROBIC AND ANAEROBIC Blood Culture results may not be optimal due to an inadequate volume of blood received in culture bottles   Culture   Final    NO GROWTH < 12 HOURS Performed at [facility], [address], [place], [place] [address]    Report Status PENDING  Incomplete         Radiology Studies: DG Chest Port 1 View  Result Date: 06/09/2020 CLINICAL DATA:  Shortness of breath EXAM: PORTABLE CHEST 1 VIEW COMPARISON:  Chest x-ray 11/18/2019, CT chest 04/04/2020 FINDINGS: The heart size and mediastinal contours are unchanged. Aortic arch calcification. Interval development of hazy airspace opacity overlying previously noted coarsened interstitial markings/scarring within the right upper lobe, right middle lobe, lingula, and left lower lobe. Bilateral lower lobe calcified granulomas again noted. No pulmonary edema. Likely trace left pleural effusion. No pneumothorax. No acute osseous abnormality. IMPRESSION: 1. Interval development of multifocal infection/inflammation. Followup PA and lateral chest X-ray is recommended in 3-4 weeks following therapy to ensure resolution and exclude underlying malignancy. 2. Likely trace left pleural effusion. Electronically Signed   By: Tish Frederickson M.D.   On: 06/09/2020 16:32        Scheduled Meds: Continuous  Infusions: . sodium chloride 100 mL/hr at 06/10/20 0100  . azithromycin    . cefTRIAXone (ROCEPHIN)  IV    . diltiazem (CARDIZEM) infusion 5 mg/hr (06/10/20 0930)  . heparin 1,500 Units/hr (06/10/20 1208)     LOS: 1 day    Time spent: 35 minutes    Berton Mount, MD  [facility] Pager #: [phone] 7PM-7AM contact night coverage as above

## 2020-06-11 ENCOUNTER — Telehealth: Payer: Self-pay | Admitting: Pulmonary Disease

## 2020-06-11 ENCOUNTER — Encounter (HOSPITAL_COMMUNITY): Payer: Self-pay | Admitting: Internal Medicine

## 2020-06-11 ENCOUNTER — Inpatient Hospital Stay (HOSPITAL_COMMUNITY): Payer: Medicare Other

## 2020-06-11 DIAGNOSIS — R609 Edema, unspecified: Secondary | ICD-10-CM

## 2020-06-11 LAB — BASIC METABOLIC PANEL
Anion gap: 14 (ref 5–15)
BUN: 56 mg/dL — ABNORMAL HIGH (ref 8–23)
CO2: 23 mmol/L (ref 22–32)
Calcium: 8.4 mg/dL — ABNORMAL LOW (ref 8.9–10.3)
Chloride: 97 mmol/L — ABNORMAL LOW (ref 98–111)
Creatinine, Ser: 2.03 mg/dL — ABNORMAL HIGH (ref 0.61–1.24)
GFR, Estimated: 32 mL/min — ABNORMAL LOW (ref >60)
Glucose, Bld: 192 mg/dL — ABNORMAL HIGH (ref 70–99)
Potassium: 4.7 mmol/L (ref 3.5–5.1)
Sodium: 134 mmol/L — ABNORMAL LOW (ref 135–145)

## 2020-06-11 LAB — COMPREHENSIVE METABOLIC PANEL
ALT: 336 U/L — ABNORMAL HIGH (ref 0–44)
AST: 139 U/L — ABNORMAL HIGH (ref 15–41)
Albumin: 2.4 g/dL — ABNORMAL LOW (ref 3.5–5.0)
Alkaline Phosphatase: 106 U/L (ref 38–126)
Anion gap: 10 (ref 5–15)
BUN: 57 mg/dL — ABNORMAL HIGH (ref 8–23)
CO2: 25 mmol/L (ref 22–32)
Calcium: 8.3 mg/dL — ABNORMAL LOW (ref 8.9–10.3)
Chloride: 98 mmol/L (ref 98–111)
Creatinine, Ser: 2.02 mg/dL — ABNORMAL HIGH (ref 0.61–1.24)
GFR, Estimated: 32 mL/min — ABNORMAL LOW (ref >60)
Glucose, Bld: 140 mg/dL — ABNORMAL HIGH (ref 70–99)
Potassium: 4.8 mmol/L (ref 3.5–5.1)
Sodium: 133 mmol/L — ABNORMAL LOW (ref 135–145)
Total Bilirubin: 0.6 mg/dL (ref 0.3–1.2)
Total Protein: 6.1 g/dL — ABNORMAL LOW (ref 6.5–8.1)

## 2020-06-11 LAB — HEPATITIS PANEL, ACUTE
HCV Ab: NONREACTIVE
Hep A IgM: NONREACTIVE
Hep B C IgM: NONREACTIVE
Hepatitis B Surface Ag: NONREACTIVE

## 2020-06-11 LAB — CBC
HCT: 31.7 % — ABNORMAL LOW (ref 39.0–52.0)
Hemoglobin: 10.1 g/dL — ABNORMAL LOW (ref 13.0–17.0)
MCH: 30.3 pg (ref 26.0–34.0)
MCHC: 31.9 g/dL (ref 30.0–36.0)
MCV: 95.2 fL (ref 80.0–100.0)
Platelets: 285 10*3/uL (ref 150–400)
RBC: 3.33 MIL/uL — ABNORMAL LOW (ref 4.22–5.81)
RDW: 13.6 % (ref 11.5–15.5)
WBC: 14.4 10*3/uL — ABNORMAL HIGH (ref 4.0–10.5)
nRBC: 0.1 % (ref 0.0–0.2)

## 2020-06-11 LAB — PROCALCITONIN: Procalcitonin: 0.47 ng/mL

## 2020-06-11 LAB — HEPARIN LEVEL (UNFRACTIONATED)
Heparin Unfractionated: 0.45 IU/mL (ref 0.30–0.70)
Heparin Unfractionated: 0.59 IU/mL (ref 0.30–0.70)

## 2020-06-11 LAB — MRSA PCR SCREENING: MRSA by PCR: NEGATIVE

## 2020-06-11 LAB — MAGNESIUM: Magnesium: 2.2 mg/dL (ref 1.7–2.4)

## 2020-06-11 MED ORDER — FUROSEMIDE 10 MG/ML IJ SOLN
40.0000 mg | Freq: Once | INTRAMUSCULAR | Status: AC
  Administered 2020-06-11: 10:00:00 40 mg via INTRAVENOUS
  Filled 2020-06-11: qty 4

## 2020-06-11 MED ORDER — LEVALBUTEROL HCL 0.63 MG/3ML IN NEBU: 0.6300 mg | INHALATION_SOLUTION | Freq: Four times a day (QID) | RESPIRATORY_TRACT | Status: DC | PRN

## 2020-06-11 MED ORDER — FUROSEMIDE 10 MG/ML IJ SOLN
40.0000 mg | Freq: Once | INTRAMUSCULAR | Status: AC
  Administered 2020-06-11: 19:00:00 40 mg via INTRAVENOUS
  Filled 2020-06-11: qty 4

## 2020-06-11 MED ORDER — PANTOPRAZOLE SODIUM 40 MG PO TBEC
40.0000 mg | DELAYED_RELEASE_TABLET | Freq: Every day | ORAL | Status: DC
  Administered 2020-06-11 – 2020-06-14 (×4): 40 mg via ORAL
  Filled 2020-06-11 (×4): qty 1

## 2020-06-11 MED ORDER — LEVALBUTEROL HCL 0.63 MG/3ML IN NEBU: 0.6300 mg | INHALATION_SOLUTION | Freq: Three times a day (TID) | RESPIRATORY_TRACT | Status: DC

## 2020-06-11 NOTE — Progress Notes (Signed)
ANTICOAGULATION CONSULT NOTE - Follow Up Consult  Pharmacy Consult for heparin Indication: atrial fibrillation  Allergies  Allergen Reactions  . Penicillin G Rash    Patient Measurements: Height: 5\' 7"  (170.2 cm) Weight: 83.2 kg (183 lb 6.8 oz) IBW/kg (Calculated) : 66.1 Heparin Dosing Weight: 82.7 kg  Vital Signs: Temp: 97.6 F (36.4 C) (12/11 2022) Temp Source: Oral (12/11 2022) BP: 105/68 (12/12 0452) Pulse Rate: 96 (12/12 0452)  Labs: Recent Labs    06/09/20 1633 06/09/20 1633 06/09/20 1635 06/09/20 1832 06/10/20 0034 06/10/20 0952 06/10/20 1503 06/10/20 2049 06/11/20 0556  HGB 10.7*  --   --   --  10.6*  --   --   --  10.1*  HCT 32.7*  --   --   --  31.4*  --   --   --  31.7*  PLT 301  --   --   --  290  --   --   --  285  LABPROT 19.9*  --   --   --   --   --   --   --   --   INR 1.8*  --   --   --   --   --   --   --   --   HEPARINUNFRC  --    < >  --   --  0.11* 0.24*  --  0.26* 0.45  CREATININE 2.11*  --   --   --  2.04*  --   --   --   --   CKTOTAL  --   --   --   --   --   --  42*  --   --   TROPONINIHS  --   --  40* 39*  --   --   --   --   --    < > = values in this interval not displayed.    Estimated Creatinine Clearance: 28.8 mL/min (A) (by C-G formula based on SCr of 2.04 mg/dL (H)).   Medications:  Medications Prior to Admission  Medication Sig Dispense Refill Last Dose  . albuterol (PROVENTIL) (2.5 MG/3ML) 0.083% nebulizer solution Take 3 mLs (2.5 mg total) by nebulization every 6 (six) hours as needed for wheezing or shortness of breath. 75 mL 1 06/09/2020 at Unknown time  . Budeson-Glycopyrrol-Formoterol (BREZTRI AEROSPHERE) 160-9-4.8 MCG/ACT AERO Inhale 2 puffs into the lungs in the morning and at bedtime. 10.7 g 1 06/09/2020 at Unknown time  . busPIRone (BUSPAR) 5 MG tablet Take 1 tablet (5 mg total) by mouth 3 (three) times daily. 90 tablet 1 06/09/2020 at Unknown time  . Cyanocobalamin (VITAMIN B 12 PO) Take 2 capsules by mouth in the  morning and at bedtime.    06/09/2020 at Unknown time  . glucosamine-chondroitin 500-400 MG tablet Take 1 tablet by mouth in the morning and at bedtime.   06/09/2020 at Unknown time  . Multiple Vitamins-Minerals (ICAPS AREDS 2 PO) Take 1 capsule by mouth in the morning and at bedtime.   06/09/2020 at Unknown time  . Omega-3 Fatty Acids (FISH OIL PO) Take 1 capsule by mouth 2 (two) times daily.    06/09/2020 at Unknown time  . VITAMIN D PO Take 2 tablets by mouth daily.   06/09/2020 at Unknown time  . Budeson-Glycopyrrol-Formoterol (BREZTRI AEROSPHERE) 160-9-4.8 MCG/ACT AERO Inhale 1 puff into the lungs in the morning and at bedtime. (Patient not taking: No sig reported) 4.8 g 0  Not Taking at Unknown time  . HYDROcodone-homatropine (HYCODAN) 5-1.5 MG/5ML syrup Take 5 mLs by mouth every 6 (six) hours as needed for cough. 240 mL 0   . sodium chloride HYPERTONIC 3 % nebulizer solution Take by nebulization 2 (two) times daily as needed for other. (Patient not taking: No sig reported) 750 mL 1 Not Taking at Unknown time   Scheduled:  . budesonide (PULMICORT) nebulizer solution  0.25 mg Nebulization BID  . levalbuterol  0.63 mg Nebulization Q6H  . predniSONE  60 mg Oral Q breakfast   Infusions:  . azithromycin 500 mg (06/10/20 2038)  . cefTRIAXone (ROCEPHIN)  IV 2 g (06/10/20 1834)  . diltiazem (CARDIZEM) infusion 5 mg/hr (06/11/20 0659)  . heparin 1,650 Units/hr (06/11/20 0201)   PRN: albuterol, guaiFENesin-dextromethorphan Anti-infectives (From admission, onward)   Start     Dose/Rate Route Frequency Ordered Stop   06/10/20 1800  cefTRIAXone (ROCEPHIN) 2 g in sodium chloride 0.9 % 100 mL IVPB        2 g 200 mL/hr over 30 Minutes Intravenous Every 24 hours 06/10/20 0003 06/15/20 1759   06/10/20 1800  azithromycin (ZITHROMAX) 500 mg in sodium chloride 0.9 % 250 mL IVPB        500 mg 250 mL/hr over 60 Minutes Intravenous Every 24 hours 06/10/20 0003 06/15/20 1759   06/09/20 1800  cefTRIAXone  (ROCEPHIN) 1 g in sodium chloride 0.9 % 100 mL IVPB        1 g 200 mL/hr over 30 Minutes Intravenous  Once 06/09/20 1757 06/09/20 1906   06/09/20 1800  azithromycin (ZITHROMAX) 500 mg in sodium chloride 0.9 % 250 mL IVPB        500 mg 250 mL/hr over 60 Minutes Intravenous  Once 06/09/20 1757 06/09/20 1924      Assessment: 84 yo male presents to the ED and was found to be in new onset atrial fibrillation with RVR. PTA the patient is not on anticoagulation. Pharmacy is consulted to dose heparin.   Heparin level is therapeutic at 0.45 while running at 1650 units/hour. Per the RN, there were no issues with the infusion and the patient is without signs or symptoms of bleeding. CBC is currently stable with Hgb 10.1, platelets 285.  Goal of Therapy:  Heparin level 0.3-0.7 units/ml Monitor platelets by anticoagulation protocol: Yes   Plan:  Continue heparin IV at 1650 units/hr Obtain 8-hour confirmatory heparin level  Monitor daily heparin level and CBC Monitor for signs and symptoms of bleeding   Sanda Klein, PharmD, RPh  PGY-1 Pharmacy Resident 06/11/2020 7:03 AM  Please check [email or web address] for unit-specific pharmacy phone numbers.

## 2020-06-11 NOTE — Progress Notes (Signed)
PROGRESS NOTE    Justin Mccarty  ZOX:[id number] DOB: March 01, 1936 DOA: 06/09/2020 PCP: Adrienne Mocha, PA  Outpatient Specialists:   Brief Narrative:  As per H&P: "Justin Mccarty is a 84 y.o. male with medical history significant of pulmonary fibrosis, GERD, hyponatremia and acute blood loss anemia who was sent over to the ER by his pulmonologist after visit today showing oxygen sats 78% on room air.  He has had progressive worsening of his symptoms.  In the last 2 weeks he has gotten worse with cough increase pain and now he has oxygen requirement which is new.  During that visit also he was noted to have irregular rhythm.  He was placed on 6 L of oxygen and sent over to the ER.  He is now about 90% on 2 L.  His initial oxygen sat in the ER was 89%.  Patient found to be in A. fib with RVR.  No prior documented history of A. fib.  He is therefore being admitted with new onset A. fib with RVR and worsening hypoxemia.  His chest x-ray indicated right lung pneumonia but has no history of dysphagia and denied any choking.  No evidence that he may have had aspiration..  ED Course: Temperature 97.9, blood pressure 113/88, pulse 103 respiratory of 30 oxygen sats 88% on room air currently 96% 3 L.  Sodium 132 potassium 4.9 chloride 99 CO2 20 glucose 143 BUN 53 creatinine 2.11 calcium 8.6 gap of 13 magnesium 2.3.  Alkaline phosphatase 135 albumin 2.5 AST 663 ALT 551 total protein 6.5.  BNP is 745.  Troponin is 40 and then 39.  White count 10.1 hemoglobin 10.7 platelets 301, PT 13.9 INR 1.8 TSH 2.645.  Acute respiratory panel is negative.  Chest x-ray shows interval development of multifocal infection.  Likely trace left pleural effusion also.  Findings in the right upper lobe right middle lobe lingula and left lower lobe.  He will be admitted for treatment of right more than left pneumonia with A. fib with RVR".  -Cardiology and pulmonary team consulted. -Echocardiogram revealed: 1. Left ventricular ejection  fraction, by estimation, is 45 to 50%. The  left ventricle has mildly decreased function. The left ventricle  demonstrates global hypokinesis. There is moderate concentric left  ventricular hypertrophy. Left ventricular  diastolic function could not be evaluated. There is the interventricular  septum is flattened in systole and diastole, consistent with right  ventricular pressure and volume overload.  2. Right ventricular systolic function is moderately reduced. The right  ventricular size is moderately enlarged. There is moderately elevated  pulmonary artery systolic pressure. The estimated right ventricular  systolic pressure is 47.7 mmHg.  3. Left atrial size was mildly dilated.  4. Right atrial size was moderately dilated.  5. The mitral valve is normal in structure. No evidence of mitral valve  regurgitation. No evidence of mitral stenosis.  6. The aortic valve is normal in structure. Aortic valve regurgitation is  mild. Mild to moderate aortic valve sclerosis/calcification is present,  without any evidence of aortic stenosis.  7. The inferior vena cava is dilated in size with <50% respiratory  variability, suggesting right atrial pressure of 15 mmHg.   -IV fluids discontinued. -Patient is started on prednisone, Pulmicort and Xopenex. -Pulmonary team has ordered IV Lasix 40 Mg x1 dose, and kept patient n.p.o. for possible bronchoscopy tomorrow. -Patient seen alongside patient's wife.  Patient has audible wheezing.  Patient was not on oxygen prior to presentation.  Prior history  of A. fib noted, no started on anticoagulation by cardiology.  Remote history of GI bleed reported. -Patient remains in A. fib, but controlled.  06/11/2020: Patient seen alongside patient's daughter.  Updated patient's daughter extensively.  Pulmonary input is appreciated.  Pulmonary team does not plan to proceed with bronchoscopy.  For optimization of volume for now.  Continue antibiotics, steroids and  nebulizers.  LFTs are improving, cannot rule out shock liver.  Kidney injury may be acute versus acute on chronic versus chronic kidney disease.  Will obtain any prior lab work done between 2010 and 2021.  If kidney injury is acute, suspect patient may have had a hypotensive episode from A. fib RVR +/- possible underlying sepsis/Pneumonia.  Renal ultrasound is nonrevealing.  Have a low threshold to consult nephrology team.  Assessment & Plan:   Principal Problem:   New onset a-fib [facility]) Active Problems:   Community acquired pneumonia   Hyponatremia   AKI (acute kidney injury) (HCC)   Transaminitis   Sepsis (HCC)     A. fib with RVR:  -Apparently, patient has had a prior history of atrial fibrillation. -Patient is known to the cardiology team. -Cardiology team has been consulted. -Heart rate is controlled.  However, patient remains in atrial fibrillation. -Patient is currently on IV Cardizem and heparin drip. -Echocardiogram is pending.   -Cardiology team is directing care.    Possible multifocal pneumonia:  -Patient is currently on azithromycin and Rocephin. -Elevated procalcitonin level. -Pulmonary team is assisting with management.     Pulmonary fibrosis/pulmonary hypertension/possible acute on chronic systolic CHF:  -Pulmonary team consulted.   -IV fluid on hold.  IV diuretics started. -Continue prednisone 60 Mg p.o. once daily, Pulmicort, Xopenex.  Continue to use albuterol as needed. -Echo result is noted, EF of 45 to 50%, diastolic dysfunction could not be assessed with RVSP of approximately 47 mmHg.  Acute kidney injury versus acute on chronic kidney disease versus chronic kidney disease:  -Last documented serum creatinine was in 2010. -Suspect that the kidney injury may actually be chronic versus acute kidney injury on chronic kidney disease. -Renal ultrasound. -Check UPC, urine sediment ANA. -Further management will depend on above. -We will have low threshold  to consult nephrology team.       Sepsis (as per admitting provider):  -Continue IV antibiotics. -0.47.  Hyponatremia:  -Serum osmolality is 300 -Urine osmolality 611 -Urine sediment is not visualized. -Cortisol is 14.2. 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 -Sodium level is improving with IV diuretics, and discontinuation of IV fluids.    Transaminitis:  -Elevated AST and ALT, but improving. -Albumin is also low. -Suspect shock liver, however, cannot rule out congestive hepatopathy. -Acute hepatitis panel is negative  Overall, prognosis is guarded.  Low threshold to consult palliative care team  DVT prophylaxis: Heparin drip. Code Status: Full code Family Communication: Wife Disposition Plan: This will depend on hospital course  Consultants:   Cardiology  Pulmonary  We will have a low threshold to consult nephrology.  Procedures:   Echocardiogram  Antimicrobials:   Azithromycin  Rocephin   Subjective: Shortness of breath is improving.. Audible wheeze is improving.  Objective: Vitals:   06/11/20 0801 06/11/20 0819 06/11/20 1014 06/11/20 1725  BP:    115/67  Pulse:    88  Resp:      Temp:      TempSrc:      SpO2: 97% 94% (!) 79% 97%  Weight:      Height:  Intake/Output Summary (Last 24 hours) at 06/11/2020 1757 Last data filed at 06/11/2020 0400 Gross per 24 hour  Intake --  Output 560 ml  Net -560 ml   Filed Weights   06/09/20 1554 06/10/20 0007  Weight: 83 kg 83.2 kg    Examination:  General exam: Appears acutely ill looking.  Audible wheeze. Respiratory system: Air entry is improving, with expiratory wheeze.   Cardiovascular system: S1 & S2, irregularly irregular.  Gastrointestinal system: Abdomen is nondistended, soft and nontender.  Organs are difficult to assess.   Central nervous system: Alert and oriented.  Patient moves all extremities.   Extremities: Bilateral lower leg edema has improved significantly.  Data  Reviewed: I have personally reviewed following labs and imaging studies  CBC: Recent Labs  Lab 06/09/20 1633 06/10/20 0034 06/11/20 0556  WBC 10.1 9.9 14.4*  NEUTROABS 8.4* 8.9*  --   HGB 10.7* 10.6* 10.1*  HCT 32.7* 31.4* 31.7*  MCV 94.8 92.9 95.2  PLT 301 290 285   Basic Metabolic Panel: Recent Labs  Lab 06/09/20 1633 06/10/20 0034 06/11/20 0556 06/11/20 1428  NA 132* 129* 133* 134*  K 4.9 5.1 4.8 4.7  CL 99 98 98 97*  CO2 20* 21* 25 23  GLUCOSE 143* 213* 140* 192*  BUN 54* 54* 57* 56*  CREATININE 2.11* 2.04* 2.02* 2.03*  CALCIUM 8.6* 8.6* 8.3* 8.4*  MG 2.3  --  2.2  --    GFR: Estimated Creatinine Clearance: 28.9 mL/min (A) (by C-G formula based on SCr of 2.03 mg/dL (H)). Liver Function Tests: Recent Labs  Lab 06/09/20 1633 06/10/20 0034 06/11/20 0556  AST 663* 404* 139*  ALT 551* 495* 336*  ALKPHOS 135* 122 106  BILITOT 0.9 0.7 0.6  PROT 6.5 6.6 6.1*  ALBUMIN 2.5* 2.5* 2.4*   No results for input(s): LIPASE, AMYLASE in the last 168 hours. No results for input(s): AMMONIA in the last 168 hours. Coagulation Profile: Recent Labs  Lab 06/09/20 1633  INR 1.8*   Cardiac Enzymes: Recent Labs  Lab 06/10/20 1503  CKTOTAL 42*   BNP (last 3 results) No results for input(s): PROBNP in the last 8760 hours. HbA1C: No results for input(s): HGBA1C in the last 72 hours. CBG: No results for input(s): GLUCAP in the last 168 hours. Lipid Profile: No results for input(s): CHOL, HDL, LDLCALC, TRIG, CHOLHDL, LDLDIRECT in the last 72 hours. Thyroid Function Tests: Recent Labs    06/10/20 1652  TSH 1.449   Anemia Panel: No results for input(s): VITAMINB12, FOLATE, FERRITIN, TIBC, IRON, RETICCTPCT in the last 72 hours. Urine analysis:    Component Value Date/Time   COLORURINE YELLOW 06/09/2020 2331   APPEARANCEUR CLEAR 06/09/2020 2331   LABSPEC 1.018 06/09/2020 2331   PHURINE 5.0 06/09/2020 2331   GLUCOSEU NEGATIVE 06/09/2020 2331   HGBUR NEGATIVE  06/09/2020 2331   BILIRUBINUR NEGATIVE 06/09/2020 2331   KETONESUR NEGATIVE 06/09/2020 2331   PROTEINUR NEGATIVE 06/09/2020 2331   UROBILINOGEN 0.2 09/13/2008 0820   NITRITE NEGATIVE 06/09/2020 2331   LEUKOCYTESUR NEGATIVE 06/09/2020 2331   Sepsis Labs: (procalcitonin:4,lacticidven:4)  ) Recent Results (from the past 240 hour(s))  Resp Panel by RT-PCR (Flu A&B, Covid) Nasopharyngeal Swab     Status: None   Collection Time: 06/09/20  9:25 PM   Specimen: Nasopharyngeal Swab; Nasopharyngeal(NP) swabs in vial transport medium  Result Value Ref Range Status   SARS Coronavirus 2 by RT PCR NEGATIVE NEGATIVE Final    Comment: (NOTE) SARS-CoV-2 target nucleic acids are  NOT DETECTED.  The SARS-CoV-2 RNA is generally detectable in upper respiratory specimens during the acute phase of infection. The lowest concentration of SARS-CoV-2 viral copies this assay can detect is 138 copies/mL. A negative result does not preclude SARS-Cov-2 infection and should not be used as the sole basis for treatment or other patient management decisions. A negative result may occur with  improper specimen collection/handling, submission of specimen other than nasopharyngeal swab, presence of viral mutation(s) within the areas targeted by this assay, and inadequate number of viral copies(<138 copies/mL). A negative result must be combined with clinical observations, patient history, and epidemiological information. The expected result is Negative.  Fact Sheet for Patients:  BloggerCourse.com  Fact Sheet for Healthcare Providers:  SeriousBroker.it  This test is no t yet approved or cleared by the [place] FDA and  has been authorized for detection and/or diagnosis of SARS-CoV-2 by FDA under an Emergency Use Authorization (EUA). This EUA will remain  in effect (meaning this test can be used) for the duration of the COVID-19 declaration under  Section 564(b)(1) of the Act, 21 U.S.C.section 360bbb-3(b)(1), unless the authorization is terminated  or revoked sooner.       Influenza A by PCR NEGATIVE NEGATIVE Final   Influenza B by PCR NEGATIVE NEGATIVE Final    Comment: (NOTE) The Xpert Xpress SARS-CoV-2/FLU/RSV plus assay is intended as an aid in the diagnosis of influenza from Nasopharyngeal swab specimens and should not be used as a sole basis for treatment. Nasal washings and aspirates are unacceptable for Xpert Xpress SARS-CoV-2/FLU/RSV testing.  Fact Sheet for Patients: BloggerCourse.com  Fact Sheet for Healthcare Providers: SeriousBroker.it  This test is not yet approved or cleared by the [place] FDA and has been authorized for detection and/or diagnosis of SARS-CoV-2 by FDA under an Emergency Use Authorization (EUA). This EUA will remain in effect (meaning this test can be used) for the duration of the COVID-19 declaration under Section 564(b)(1) of the Act, 21 U.S.C. section 360bbb-3(b)(1), unless the authorization is terminated or revoked.  Performed at [facility], [address] [place], [place] [address]   Culture, blood (routine x 2) Call MD if unable to obtain prior to antibiotics being given     Status: None (Preliminary result)   Collection Time: 06/10/20 12:34 AM   Specimen: BLOOD RIGHT HAND  Result Value Ref Range Status   Specimen Description BLOOD RIGHT HAND  Final   Special Requests AEROBIC BOTTLE ONLY Blood Culture adequate volume  Final   Culture   Final    NO GROWTH 1 DAY Performed at [facility], [address], [place], [place] [address]    Report Status PENDING  Incomplete  Culture, blood (routine x 2) Call MD if unable to obtain prior to antibiotics being given     Status: None (Preliminary result)   Collection Time: 06/10/20 12:54 AM   Specimen: BLOOD LEFT HAND  Result Value Ref Range Status   Specimen Description  BLOOD LEFT HAND  Final   Special Requests   Final    BOTTLES DRAWN AEROBIC AND ANAEROBIC Blood Culture results may not be optimal due to an inadequate volume of blood received in culture bottles   Culture   Final    NO GROWTH 1 DAY Performed at [facility], [address], [place], [place] [address]    Report Status PENDING  Incomplete  Respiratory Panel by PCR     Status: None   Collection Time:  06/10/20  1:41 PM   Specimen: Nasopharyngeal Swab; Respiratory  Result Value Ref Range Status   Adenovirus NOT DETECTED NOT DETECTED Final   Coronavirus 229E NOT DETECTED NOT DETECTED Final    Comment: (NOTE) The Coronavirus on the Respiratory Panel, DOES NOT test for the novel  Coronavirus (2019 nCoV)    Coronavirus HKU1 NOT DETECTED NOT DETECTED Final   Coronavirus NL63 NOT DETECTED NOT DETECTED Final   Coronavirus OC43 NOT DETECTED NOT DETECTED Final   Metapneumovirus NOT DETECTED NOT DETECTED Final   Rhinovirus / Enterovirus NOT DETECTED NOT DETECTED Final   Influenza A NOT DETECTED NOT DETECTED Final   Influenza B NOT DETECTED NOT DETECTED Final   Parainfluenza Virus 1 NOT DETECTED NOT DETECTED Final   Parainfluenza Virus 2 NOT DETECTED NOT DETECTED Final   Parainfluenza Virus 3 NOT DETECTED NOT DETECTED Final   Parainfluenza Virus 4 NOT DETECTED NOT DETECTED Final   Respiratory Syncytial Virus NOT DETECTED NOT DETECTED Final   Bordetella pertussis NOT DETECTED NOT DETECTED Final   Bordetella Parapertussis NOT DETECTED NOT DETECTED Final   Chlamydophila pneumoniae NOT DETECTED NOT DETECTED Final   Mycoplasma pneumoniae NOT DETECTED NOT DETECTED Final    Comment: Performed at [facility], [address] [place], [place] [address]  MRSA PCR Screening     Status: None   Collection Time: 06/11/20  9:32 AM   Specimen: Nasal Mucosa; Nasopharyngeal  Result Value Ref Range Status   MRSA by PCR NEGATIVE NEGATIVE Final    Comment:        The GeneXpert MRSA Assay  (FDA approved for NASAL specimens only), is one component of a comprehensive MRSA colonization surveillance program. It is not intended to diagnose MRSA infection nor to guide or monitor treatment for MRSA infections. Performed at [facility], [address] [place], [place] [address]          Radiology Studies: US RENAL  Result Date: 06/10/2020 CLINICAL DATA:  AK I EXAM: RENAL / URINARY TRACT ULTRASOUND COMPLETE COMPARISON:  None. FINDINGS: Right Kidney: Renal measurements: 9.6 x 5.5 x 3.8 cm = volume: 105 mL. No definite hydronephrosis. There is an exophytic cyst measuring 1.7 cm. Left Kidney: Renal measurements: 11.0 x 5.8 x 5.4 cm = volume: 180 mL. No definite hydronephrosis or renal mass. Bladder: Appears normal for degree of bladder distention. Other: None. IMPRESSION: Exam limited by shadowing bowel gas and patient body habitus. Within these limitations no acute finding in the bilateral kidneys. Right renal cyst. Electronically Signed   By: Emmaline Kluver M.D.   On: 06/10/2020 18:14   ECHOCARDIOGRAM COMPLETE  Result Date: 06/10/2020    ECHOCARDIOGRAM REPORT   Patient Name:   ANGELA PLATNER Dung Date of Exam: 06/10/2020 Medical Rec #:  [id number]     Height:       67.0 in Accession #:    [id number]    Weight:       183.4 lb Date of Birth:  29-Mar-1936      BSA:          1.949 m Patient Age:    84 years      BP:           118/61 mmHg Patient Gender: M             HR:           78 bpm. Exam Location:  Inpatient Procedure: 2D Echo, Cardiac Doppler and Color Doppler Indications:  Atrial fibrillation  History:        Patient has no prior history of Echocardiogram examinations.                 Arrythmias:Atrial Fibrillation.  Sonographer:    Ross LudwigArthur Guy RDCS (AE) Referring Phys: [id number] [facility] GarnerMOHAMMAD Jerelyn CharlesL GARBA  Sonographer Comments: Image acquisition challenging due to respiratory motion. IMPRESSIONS  1. Left ventricular ejection fraction, by estimation, is 45 to 50%. The left ventricle has mildly  decreased function. The left ventricle demonstrates global hypokinesis. There is moderate concentric left ventricular hypertrophy. Left ventricular diastolic function could not be evaluated. There is the interventricular septum is flattened in systole and diastole, consistent with right ventricular pressure and volume overload.  2. Right ventricular systolic function is moderately reduced. The right ventricular size is moderately enlarged. There is moderately elevated pulmonary artery systolic pressure. The estimated right ventricular systolic pressure is 47.7 mmHg.  3. Left atrial size was mildly dilated.  4. Right atrial size was moderately dilated.  5. The mitral valve is normal in structure. No evidence of mitral valve regurgitation. No evidence of mitral stenosis.  6. The aortic valve is normal in structure. Aortic valve regurgitation is mild. Mild to moderate aortic valve sclerosis/calcification is present, without any evidence of aortic stenosis.  7. The inferior vena cava is dilated in size with <50% respiratory variability, suggesting right atrial pressure of 15 mmHg. FINDINGS  Left Ventricle: Left ventricular ejection fraction, by estimation, is 45 to 50%. The left ventricle has mildly decreased function. The left ventricle demonstrates global hypokinesis. The left ventricular internal cavity size was normal in size. There is  moderate concentric left ventricular hypertrophy. The interventricular septum is flattened in systole and diastole, consistent with right ventricular pressure and volume overload. Left ventricular diastolic function could not be evaluated due to atrial fibrillation. Left ventricular diastolic function could not be evaluated. Right Ventricle: The right ventricular size is moderately enlarged. No increase in right ventricular wall thickness. Right ventricular systolic function is moderately reduced. There is moderately elevated pulmonary artery systolic pressure. The tricuspid   regurgitant velocity is 2.86 m/s, and with an assumed right atrial pressure of 15 mmHg, the estimated right ventricular systolic pressure is 47.7 mmHg. Left Atrium: Left atrial size was mildly dilated. Right Atrium: Right atrial size was moderately dilated. Pericardium: There is no evidence of pericardial effusion. Mitral Valve: The mitral valve is normal in structure. There is mild thickening of the mitral valve leaflet(s). There is mild calcification of the mitral valve leaflet(s). Mild mitral annular calcification. No evidence of mitral valve regurgitation. No evidence of mitral valve stenosis. Tricuspid Valve: The tricuspid valve is normal in structure. Tricuspid valve regurgitation is mild . No evidence of tricuspid stenosis. Aortic Valve: The aortic valve is normal in structure. Aortic valve regurgitation is mild. Aortic regurgitation PHT measures 988 msec. Mild to moderate aortic valve sclerosis/calcification is present, without any evidence of aortic stenosis. Aortic valve  mean gradient measures 2.0 mmHg. Aortic valve peak gradient measures 3.6 mmHg. Aortic valve area, by VTI measures 2.88 cm. Pulmonic Valve: The pulmonic valve was normal in structure. Pulmonic valve regurgitation is not visualized. No evidence of pulmonic stenosis. Aorta: The aortic root is normal in size and structure. Venous: The inferior vena cava is dilated in size with less than 50% respiratory variability, suggesting right atrial pressure of 15 mmHg. IAS/Shunts: No atrial level shunt detected by color flow Doppler.  LEFT VENTRICLE PLAX 2D LVIDd:  5.20 cm LVIDs:         4.00 cm LV PW:         1.30 cm LV IVS:        1.40 cm LVOT diam:     2.00 cm LV SV:         46 LV SV Index:   24 LVOT Area:     3.14 cm  RIGHT VENTRICLE            IVC RV Basal diam:  3.30 cm    IVC diam: 2.50 cm RV S prime:     8.79 cm/s TAPSE (M-mode): 1.2 cm LEFT ATRIUM             Index       RIGHT ATRIUM           Index LA diam:        4.10 cm 2.10 cm/m   RA Area:     17.80 cm LA Vol (A2C):   46.2 ml 23.70 ml/m RA Volume:   43.40 ml  22.27 ml/m LA Vol (A4C):   61.1 ml 31.35 ml/m LA Biplane Vol: 58.8 ml 30.17 ml/m  AORTIC VALVE AV Area (Vmax):    2.62 cm AV Area (Vmean):   2.49 cm AV Area (VTI):     2.88 cm AV Vmax:           95.18 cm/s AV Vmean:          70.025 cm/s AV VTI:            0.161 m AV Peak Grad:      3.6 mmHg AV Mean Grad:      2.0 mmHg LVOT Vmax:         79.48 cm/s LVOT Vmean:        55.500 cm/s LVOT VTI:          0.148 m LVOT/AV VTI ratio: 0.92 AI PHT:            988 msec  AORTA Ao Root diam: 3.50 cm Ao Asc diam:  3.30 cm TRICUSPID VALVE TR Peak grad:   32.7 mmHg TR Vmax:        286.00 cm/s  SHUNTS Systemic VTI:  0.15 m Systemic Diam: 2.00 cm Tobias Alexander MD Electronically signed by Tobias Alexander MD Signature Date/Time: 06/10/2020/1:51:20 PM    Final    VAS [place] LOWER EXTREMITY VENOUS (DVT)  Result Date: 06/11/2020  Lower Venous DVT Study Indications: Edema.  Risk Factors: Atrial fibrillation. Comparison Study: No prior study on file Performing Technologist: Sherren Kerns RVS  Examination Guidelines: A complete evaluation includes B-mode imaging, spectral Doppler, color Doppler, and power Doppler as needed of all accessible portions of each vessel. Bilateral testing is considered an integral part of a complete examination. Limited examinations for reoccurring indications may be performed as noted. The reflux portion of the exam is performed with the patient in reverse Trendelenburg.  +---------+---------------+---------+-----------+----------+--------------+ RIGHT    CompressibilityPhasicitySpontaneityPropertiesThrombus Aging +---------+---------------+---------+-----------+----------+--------------+ CFV      Full                                         pulsatile      +---------+---------------+---------+-----------+----------+--------------+ SFJ      Full                                                         +---------+---------------+---------+-----------+----------+--------------+  FV Prox  Full                                                        +---------+---------------+---------+-----------+----------+--------------+ FV Mid   Full                                                        +---------+---------------+---------+-----------+----------+--------------+ FV DistalFull                                                        +---------+---------------+---------+-----------+----------+--------------+ PFV      Full                                                        +---------+---------------+---------+-----------+----------+--------------+ POP      Full                                         pulsatile      +---------+---------------+---------+-----------+----------+--------------+ PTV      Full                                                        +---------+---------------+---------+-----------+----------+--------------+ PERO     Full                                                        +---------+---------------+---------+-----------+----------+--------------+   +---------+---------------+---------+-----------+----------+--------------+ LEFT     CompressibilityPhasicitySpontaneityPropertiesThrombus Aging +---------+---------------+---------+-----------+----------+--------------+ CFV      Full                                         pulsatile      +---------+---------------+---------+-----------+----------+--------------+ SFJ      Full                                                        +---------+---------------+---------+-----------+----------+--------------+ FV Prox  Full                                                        +---------+---------------+---------+-----------+----------+--------------+  FV Mid   Full                                                         +---------+---------------+---------+-----------+----------+--------------+ FV DistalFull                                                        +---------+---------------+---------+-----------+----------+--------------+ PFV      Full                                                        +---------+---------------+---------+-----------+----------+--------------+ POP      Full                                         pulsatile      +---------+---------------+---------+-----------+----------+--------------+ PTV      Full                                                        +---------+---------------+---------+-----------+----------+--------------+ PERO     Full                                                        +---------+---------------+---------+-----------+----------+--------------+     Summary: RIGHT: - There is no evidence of deep vein thrombosis in the lower extremity.  LEFT: - There is no evidence of deep vein thrombosis in the lower extremity.  *See table(s) above for measurements and observations.    Preliminary         Scheduled Meds: . budesonide (PULMICORT) nebulizer solution  0.25 mg Nebulization BID  . pantoprazole  40 mg Oral Daily  . predniSONE  60 mg Oral Q breakfast   Continuous Infusions: . azithromycin 500 mg (06/10/20 2038)  . cefTRIAXone (ROCEPHIN)  IV 2 g (06/11/20 1716)  . diltiazem (CARDIZEM) infusion 5 mg/hr (06/11/20 0659)  . heparin 1,650 Units/hr (06/11/20 1715)     LOS: 2 days    Time spent: 35 minutes    Berton Mount, MD  [facility] Pager #: [phone] 7PM-7AM contact night coverage as above

## 2020-06-11 NOTE — Progress Notes (Signed)
VASCULAR LAB    Bilateral lower extremity venous duplex has been performed.  See CV proc for preliminary results.   Zen Cedillos, RVT 06/11/2020, 12:43 PM

## 2020-06-11 NOTE — Progress Notes (Signed)
ANTICOAGULATION CONSULT NOTE - Follow Up Consult  Pharmacy Consult for heparin Indication: atrial fibrillation  Allergies  Allergen Reactions  . Penicillin G Rash    Patient Measurements: Height: 5\' 7"  (170.2 cm) Weight: 83.2 kg (183 lb 6.8 oz) IBW/kg (Calculated) : 66.1 Heparin Dosing Weight: 82.7 kg  Vital Signs: Temp: 97.5 F (36.4 C) (12/12 0750) Temp Source: Oral (12/12 0750) BP: 102/78 (12/12 0750) Pulse Rate: 106 (12/12 0750)  Labs: Recent Labs    06/09/20 1633 06/09/20 1635 06/09/20 1832 06/10/20 0034 06/10/20 0952 06/10/20 1503 06/10/20 2049 06/11/20 0556 06/11/20 1428 06/11/20 1515  HGB 10.7*  --   --  10.6*  --   --   --  10.1*  --   --   HCT 32.7*  --   --  31.4*  --   --   --  31.7*  --   --   PLT 301  --   --  290  --   --   --  285  --   --   LABPROT 19.9*  --   --   --   --   --   --   --   --   --   INR 1.8*  --   --   --   --   --   --   --   --   --   HEPARINUNFRC  --   --   --  0.11*   < >  --  0.26* 0.45  --  0.59  CREATININE 2.11*  --   --  2.04*  --   --   --  2.02* 2.03*  --   CKTOTAL  --   --   --   --   --  42*  --   --   --   --   TROPONINIHS  --  40* 39*  --   --   --   --   --   --   --    < > = values in this interval not displayed.    Estimated Creatinine Clearance: 28.9 mL/min (A) (by C-G formula based on SCr of 2.03 mg/dL (H)).   Medications:  Medications Prior to Admission  Medication Sig Dispense Refill Last Dose  . albuterol (PROVENTIL) (2.5 MG/3ML) 0.083% nebulizer solution Take 3 mLs (2.5 mg total) by nebulization every 6 (six) hours as needed for wheezing or shortness of breath. 75 mL 1 06/09/2020 at Unknown time  . Budeson-Glycopyrrol-Formoterol (BREZTRI AEROSPHERE) 160-9-4.8 MCG/ACT AERO Inhale 2 puffs into the lungs in the morning and at bedtime. 10.7 g 1 06/09/2020 at Unknown time  . busPIRone (BUSPAR) 5 MG tablet Take 1 tablet (5 mg total) by mouth 3 (three) times daily. 90 tablet 1 06/09/2020 at Unknown time  .  Cyanocobalamin (VITAMIN B 12 PO) Take 2 capsules by mouth in the morning and at bedtime.    06/09/2020 at Unknown time  . glucosamine-chondroitin 500-400 MG tablet Take 1 tablet by mouth in the morning and at bedtime.   06/09/2020 at Unknown time  . Multiple Vitamins-Minerals (ICAPS AREDS 2 PO) Take 1 capsule by mouth in the morning and at bedtime.   06/09/2020 at Unknown time  . Omega-3 Fatty Acids (FISH OIL PO) Take 1 capsule by mouth 2 (two) times daily.    06/09/2020 at Unknown time  . VITAMIN D PO Take 2 tablets by mouth daily.   06/09/2020 at Unknown time  . Budeson-Glycopyrrol-Formoterol (BREZTRI  AEROSPHERE) 160-9-4.8 MCG/ACT AERO Inhale 1 puff into the lungs in the morning and at bedtime. (Patient not taking: No sig reported) 4.8 g 0 Not Taking at Unknown time  . HYDROcodone-homatropine (HYCODAN) 5-1.5 MG/5ML syrup Take 5 mLs by mouth every 6 (six) hours as needed for cough. 240 mL 0   . sodium chloride HYPERTONIC 3 % nebulizer solution Take by nebulization 2 (two) times daily as needed for other. (Patient not taking: No sig reported) 750 mL 1 Not Taking at Unknown time   Scheduled:  . budesonide (PULMICORT) nebulizer solution  0.25 mg Nebulization BID  . pantoprazole  40 mg Oral Daily  . predniSONE  60 mg Oral Q breakfast   Infusions:  . azithromycin 500 mg (06/10/20 2038)  . cefTRIAXone (ROCEPHIN)  IV 2 g (06/10/20 1834)  . diltiazem (CARDIZEM) infusion 5 mg/hr (06/11/20 0659)  . heparin 1,650 Units/hr (06/11/20 0201)   PRN: guaiFENesin-dextromethorphan, levalbuterol Anti-infectives (From admission, onward)   Start     Dose/Rate Route Frequency Ordered Stop   06/10/20 1800  cefTRIAXone (ROCEPHIN) 2 g in sodium chloride 0.9 % 100 mL IVPB        2 g 200 mL/hr over 30 Minutes Intravenous Every 24 hours 06/10/20 0003 06/15/20 1759   06/10/20 1800  azithromycin (ZITHROMAX) 500 mg in sodium chloride 0.9 % 250 mL IVPB        500 mg 250 mL/hr over 60 Minutes Intravenous Every 24 hours  06/10/20 0003 06/15/20 1759   06/09/20 1800  cefTRIAXone (ROCEPHIN) 1 g in sodium chloride 0.9 % 100 mL IVPB        1 g 200 mL/hr over 30 Minutes Intravenous  Once 06/09/20 1757 06/09/20 1906   06/09/20 1800  azithromycin (ZITHROMAX) 500 mg in sodium chloride 0.9 % 250 mL IVPB        500 mg 250 mL/hr over 60 Minutes Intravenous  Once 06/09/20 1757 06/09/20 1924      Assessment: 84 yo male presents to the ED and was found to be in new onset atrial fibrillation with RVR. PTA the patient is not on anticoagulation. Pharmacy is consulted to dose heparin. P  Heparin level is therapeutic at 0.45 on 1650 units/hour.  Goal of Therapy:  Heparin level 0.3-0.7 units/ml Monitor platelets by anticoagulation protocol: Yes   Plan:  Continue heparin IV at 1650 units/hr Monitor daily heparin level and CBC  Harland German, PharmD Clinical Pharmacist **Pharmacist phone directory can now be found on [email or web address] ([email or web address]).  Listed under [facility].

## 2020-06-11 NOTE — Progress Notes (Addendum)
Progress Note  Patient Name: Justin Mccarty Date of Encounter: 06/11/2020  [facility] Cardiologist: Dr. Rayann Heman  Subjective   Denies any CP, breathing slightly better this morning.   Inpatient Medications    Scheduled Meds:  budesonide (PULMICORT) nebulizer solution  0.25 mg Nebulization BID   furosemide  40 mg Intravenous Once   levalbuterol  0.63 mg Nebulization TID   pantoprazole  40 mg Oral Daily   predniSONE  60 mg Oral Q breakfast   Continuous Infusions:  azithromycin 500 mg (06/10/20 2038)   cefTRIAXone (ROCEPHIN)  IV 2 g (06/10/20 1834)   diltiazem (CARDIZEM) infusion 5 mg/hr (06/11/20 0659)   heparin 1,650 Units/hr (06/11/20 0201)   PRN Meds: albuterol, guaiFENesin-dextromethorphan   Vital Signs    Vitals:   06/11/20 0452 06/11/20 0750 06/11/20 0819 06/11/20 1014  BP: 105/68 102/78    Pulse: 96 (!) 106    Resp:  18    Temp:  (!) 97.5 F (36.4 C)    TempSrc:  Oral    SpO2: 99% 91% 94% (!) 79%  Weight:      Height:        Intake/Output Summary (Last 24 hours) at 06/11/2020 1026 Last data filed at 06/11/2020 0400 Gross per 24 hour  Intake --  Output 735 ml  Net -735 ml   Last 3 Weights 06/10/2020 06/09/2020 06/06/2020  Weight (lbs) 183 lb 6.8 oz 182 lb 15.7 oz 184 lb 12.8 oz  Weight (kg) 83.2 kg 83 kg 83.825 kg      Telemetry    Atrial fibrillation with HR 90-100s - Personally Reviewed  ECG    Atrial fibrillation with RVR - Personally Reviewed  Physical Exam   GEN: No acute distress.   Neck: No JVD Cardiac: irregularly irregular, no murmurs, rubs, or gallops.  Respiratory: prominent wheezing throughout, mild intermittent crackles in the bases GI: Soft, nontender, non-distended  MS: No edema; No deformity. Neuro:  Nonfocal  Psych: Normal affect   Labs    High Sensitivity Troponin:   Recent Labs  Lab 06/09/20 1635 06/09/20 1832  TROPONINIHS 40* 39*      Chemistry Recent Labs  Lab 06/09/20 1633 06/10/20 0034  06/11/20 0556  NA 132* 129* 133*  K 4.9 5.1 4.8  CL 99 98 98  CO2 20* 21* 25  GLUCOSE 143* 213* 140*  BUN 54* 54* 57*  CREATININE 2.11* 2.04* 2.02*  CALCIUM 8.6* 8.6* 8.3*  PROT 6.5 6.6 6.1*  ALBUMIN 2.5* 2.5* 2.4*  AST 663* 404* 139*  ALT 551* 495* 336*  ALKPHOS 135* 122 106  BILITOT 0.9 0.7 0.6  GFRNONAA 31* 32* 32*  ANIONGAP _0 Hematology Recent Labs  Lab 06/09/20 1633 06/10/20 0034 06/11/20 0556  WBC 10.1 9.9 14.4*  RBC 3.45* 3.38* 3.33*  HGB 10.7* 10.6* 10.1*  HCT 32.7* 31.4* 31.7*  MCV 94.8 92.9 95.2  MCH 31.0 31.4 30.3  MCHC 32.7 33.8 31.9  RDW 13.7 13.4 13.6  PLT 301 290 285    BNP Recent Labs  Lab 06/09/20 1634  BNP 745.0*     DDimer No results for input(s): DDIMER in the last 168 hours.   Radiology    US RENAL  Result Date: 06/10/2020 CLINICAL DATA:  AK I EXAM: RENAL / URINARY TRACT ULTRASOUND COMPLETE COMPARISON:  None. FINDINGS: Right Kidney: Renal measurements: 9.6 x 5.5 x 3.8 cm = volume: 105 mL. No definite hydronephrosis. There is an exophytic cyst measuring 1.7  cm. Left Kidney: Renal measurements: 11.0 x 5.8 x 5.4 cm = volume: 180 mL. No definite hydronephrosis or renal mass. Bladder: Appears normal for degree of bladder distention. Other: None. IMPRESSION: Exam limited by shadowing bowel gas and patient body habitus. Within these limitations no acute finding in the bilateral kidneys. Right renal cyst. Electronically Signed   By: Audie Pinto M.D.   On: 06/10/2020 18:14   DG Chest Port 1 View  Result Date: 06/09/2020 CLINICAL DATA:  Shortness of breath EXAM: PORTABLE CHEST 1 VIEW COMPARISON:  Chest x-ray 11/18/2019, CT chest 04/04/2020 FINDINGS: The heart size and mediastinal contours are unchanged. Aortic arch calcification. Interval development of hazy airspace opacity overlying previously noted coarsened interstitial markings/scarring within the right upper lobe, right middle lobe, lingula, and left lower lobe. Bilateral lower  lobe calcified granulomas again noted. No pulmonary edema. Likely trace left pleural effusion. No pneumothorax. No acute osseous abnormality. IMPRESSION: 1. Interval development of multifocal infection/inflammation. Followup PA and lateral chest X-ray is recommended in 3-4 weeks following therapy to ensure resolution and exclude underlying malignancy. 2. Likely trace left pleural effusion. Electronically Signed   By: Iven Finn M.D.   On: 06/09/2020 16:32   ECHOCARDIOGRAM COMPLETE  Result Date: 06/10/2020    ECHOCARDIOGRAM REPORT   Patient Name:   Justin Mccarty Salce Date of Exam: 06/10/2020 Medical Rec #:  [id number]     Height:       67.0 in Accession #:    [id number]    Weight:       183.4 lb Date of Birth:  1935-12-23      BSA:          1.949 m Patient Age:    84 years      BP:           118/61 mmHg Patient Gender: M             HR:           78 bpm. Exam Location:  Inpatient Procedure: 2D Echo, Cardiac Doppler and Color Doppler Indications:    Atrial fibrillation  History:        Patient has no prior history of Echocardiogram examinations.                 Arrythmias:Atrial Fibrillation.  Sonographer:    Clayton Lefort RDCS (AE) Referring Phys: [id number] [facility] Julious Oka  Sonographer Comments: Image acquisition challenging due to respiratory motion. IMPRESSIONS  1. Left ventricular ejection fraction, by estimation, is 45 to 50%. The left ventricle has mildly decreased function. The left ventricle demonstrates global hypokinesis. There is moderate concentric left ventricular hypertrophy. Left ventricular diastolic function could not be evaluated. There is the interventricular septum is flattened in systole and diastole, consistent with right ventricular pressure and volume overload.  2. Right ventricular systolic function is moderately reduced. The right ventricular size is moderately enlarged. There is moderately elevated pulmonary artery systolic pressure. The estimated right ventricular systolic pressure is 93.2  mmHg.  3. Left atrial size was mildly dilated.  4. Right atrial size was moderately dilated.  5. The mitral valve is normal in structure. No evidence of mitral valve regurgitation. No evidence of mitral stenosis.  6. The aortic valve is normal in structure. Aortic valve regurgitation is mild. Mild to moderate aortic valve sclerosis/calcification is present, without any evidence of aortic stenosis.  7. The inferior vena cava is dilated in size with <50% respiratory variability, suggesting right atrial pressure of 15 mmHg. FINDINGS  Left Ventricle: Left ventricular ejection fraction, by estimation, is 45 to 50%. The left ventricle has mildly decreased function. The left ventricle demonstrates global hypokinesis. The left ventricular internal cavity size was normal in size. There is  moderate concentric left ventricular hypertrophy. The interventricular septum is flattened in systole and diastole, consistent with right ventricular pressure and volume overload. Left ventricular diastolic function could not be evaluated due to atrial fibrillation. Left ventricular diastolic function could not be evaluated. Right Ventricle: The right ventricular size is moderately enlarged. No increase in right ventricular wall thickness. Right ventricular systolic function is moderately reduced. There is moderately elevated pulmonary artery systolic pressure. The tricuspid  regurgitant velocity is 2.86 m/s, and with an assumed right atrial pressure of 15 mmHg, the estimated right ventricular systolic pressure is 32.3 mmHg. Left Atrium: Left atrial size was mildly dilated. Right Atrium: Right atrial size was moderately dilated. Pericardium: There is no evidence of pericardial effusion. Mitral Valve: The mitral valve is normal in structure. There is mild thickening of the mitral valve leaflet(s). There is mild calcification of the mitral valve leaflet(s). Mild mitral annular calcification. No evidence of mitral valve regurgitation. No  evidence of mitral valve stenosis. Tricuspid Valve: The tricuspid valve is normal in structure. Tricuspid valve regurgitation is mild . No evidence of tricuspid stenosis. Aortic Valve: The aortic valve is normal in structure. Aortic valve regurgitation is mild. Aortic regurgitation PHT measures 988 msec. Mild to moderate aortic valve sclerosis/calcification is present, without any evidence of aortic stenosis. Aortic valve  mean gradient measures 2.0 mmHg. Aortic valve peak gradient measures 3.6 mmHg. Aortic valve area, by VTI measures 2.88 cm. Pulmonic Valve: The pulmonic valve was normal in structure. Pulmonic valve regurgitation is not visualized. No evidence of pulmonic stenosis. Aorta: The aortic root is normal in size and structure. Venous: The inferior vena cava is dilated in size with less than 50% respiratory variability, suggesting right atrial pressure of 15 mmHg. IAS/Shunts: No atrial level shunt detected by color flow Doppler.  LEFT VENTRICLE PLAX 2D LVIDd:         5.20 cm LVIDs:         4.00 cm LV PW:         1.30 cm LV IVS:        1.40 cm LVOT diam:     2.00 cm LV SV:         46 LV SV Index:   24 LVOT Area:     3.14 cm  RIGHT VENTRICLE            IVC RV Basal diam:  3.30 cm    IVC diam: 2.50 cm RV S prime:     8.79 cm/s TAPSE (M-mode): 1.2 cm LEFT ATRIUM             Index       RIGHT ATRIUM           Index LA diam:        4.10 cm 2.10 cm/m  RA Area:     17.80 cm LA Vol (A2C):   46.2 ml 23.70 ml/m RA Volume:   43.40 ml  22.27 ml/m LA Vol (A4C):   61.1 ml 31.35 ml/m LA Biplane Vol: 58.8 ml 30.17 ml/m  AORTIC VALVE AV Area (Vmax):    2.62 cm AV Area (Vmean):   2.49 cm AV Area (VTI):     2.88 cm AV Vmax:           95.18 cm/s  AV Vmean:          70.025 cm/s AV VTI:            0.161 m AV Peak Grad:      3.6 mmHg AV Mean Grad:      2.0 mmHg LVOT Vmax:         79.48 cm/s LVOT Vmean:        55.500 cm/s LVOT VTI:          0.148 m LVOT/AV VTI ratio: 0.92 AI PHT:            988 msec  AORTA Ao Root diam:  3.50 cm Ao Asc diam:  3.30 cm TRICUSPID VALVE TR Peak grad:   32.7 mmHg TR Vmax:        286.00 cm/s  SHUNTS Systemic VTI:  0.15 m Systemic Diam: 2.00 cm Ena Dawley MD Electronically signed by Ena Dawley MD Signature Date/Time: 06/10/2020/1:51:20 PM    Final     Cardiac Studies   Echo 06/10/2020 1. Left ventricular ejection fraction, by estimation, is 45 to 50%. The  left ventricle has mildly decreased function. The left ventricle  demonstrates global hypokinesis. There is moderate concentric left  ventricular hypertrophy. Left ventricular  diastolic function could not be evaluated. There is the interventricular  septum is flattened in systole and diastole, consistent with right  ventricular pressure and volume overload.   2. Right ventricular systolic function is moderately reduced. The right  ventricular size is moderately enlarged. There is moderately elevated  pulmonary artery systolic pressure. The estimated right ventricular  systolic pressure is 09.3 mmHg.   3. Left atrial size was mildly dilated.   4. Right atrial size was moderately dilated.   5. The mitral valve is normal in structure. No evidence of mitral valve  regurgitation. No evidence of mitral stenosis.   6. The aortic valve is normal in structure. Aortic valve regurgitation is  mild. Mild to moderate aortic valve sclerosis/calcification is present,  without any evidence of aortic stenosis.   7. The inferior vena cava is dilated in size with <50% respiratory  variability, suggesting right atrial pressure of 15 mmHg.   Patient Profile     84 y.o. male who is recently undergoing workup for pulmonary issue presented with respiratory failure and atrial fibrillation with RVR  Assessment & Plan    1. PAF  - suspect afib exacerbated by breathing issue.   - continue with IV diltiazem and IV heparin. HR 90-low 100s which is fine, expect his HR a little higher than others mainly with breathing issue. No BB given  pulmonary issue.   - may transition to 131m daily of diltiazem CD and Eliquis in the future, no urgent need to switch given potential pulmonary workup such as bronchoscopy.   2. Acute hypoxemic respiratory failure: Sed rate elevated, pending further pulmonology workup including possible bronchoscopy.   3. LV dysfunction: EF 45-50% on echocardiogram. He does not appears to be significantly volume overloaded based on physical exam, but echo did showed flattened interventricular septum concerning for volume overload. He was given a single dose of IV lasix this morning.   4. Renal insufficiency: unclear chronicity, Cr >2.0       For questions or updates, please contact CPanolaPlease consult [email or web address] for contact info under        Signed, HAlmyra Deforest PBayou La Batre 06/11/2020, 10:26 AM    Patient examined chart reviewed see consult note from yesterday Lung exam still bad with  diffuse wheezing/rhonchi.  AFib rates are fine on cardizem  Continue heparin as patient may need bronchoscopy. Ok to assess daily need for lasix with elevated BNP 700 range. Biggest issue is ILD with now elevated ESR. Pulmonary seeing in hospital On steroids and nebs   Jenkins Rouge MD [facility]

## 2020-06-11 NOTE — Consult Note (Signed)
NAME:  Justin Mccarty, MRN:  [id number], DOB:  [birth year], LOS: 2 ADMISSION DATE:  06/09/2020, CONSULTATION DATE:  06/10/20 REFERRING MD:  Dana Allan, MD CHIEF COMPLAINT:  Shortness of breath   Brief History   Justin Mccarty is an 84 year old male, never smoker with history of GERD who has been following in pulmonary clinic for cough and shortness of breath over recent months. He has been diagnosed with non-specific interstitial pneumonia based on CT imaging in July and October with pulmonary function tests showing mild restrictive defect and mild diffusion defect.     Inflammatory workup is negative for ANA, RA, non elevated ESR and negative hypersensitivity panel. 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       He has continued to have progressive symptoms over recent months and comes in acute short of breath over the past 2 weeks. He has new oxygen requirement 2-3L at rest. He has productive cough at this time when it is typically dry.     He has been found to be in atrial fibrillation with RVR requiring diltiazem drip.     Chest radiograph is concerning for new scattered bilateral opacities.    BNP is 745, LDH 279, Cr 2.04 (baseline 1.4).    Echo shows EF 45-50%, global hypokinesis. RV systolic function is mildly reduced. RV size is moderately enlarged. Moderately elevated RVSP at 47.20mHg. Left atrial size mildly dilated. Right atrial size was moderately dilated.    Consults:  Cardiology PCCM  Procedures:  n/a  Significant Diagnostic Tests:  CXR 12/10 - interval development of multifocal airspace opacities bilaterally Procalcitonin 12/11 - 0.69 CRP 12.7   Micro Data:  Extended Respiratory Pathogen Panel 12/11 - Negative   Antimicrobials:  Ceftriaxone 12/10>>  Azithromycin 12/10>>  Interim history/subjective:   Patient feeling better than yesterday -7387min past 24hours  Denies any productive cough at this time.  Patient's wife and son are at the bedside.  Objective   Blood pressure 102/78, pulse (!) 106, temperature (!) 97.5 F (36.4 C), temperature source Oral, resp. rate 18, height _0  (1.702 m), weight 83.2 kg, SpO2 94 %.        Intake/Output Summary (Last 24 hours) at 06/11/2020 0928 Last data filed at 06/11/2020 0400 Gross per 24 hour  Intake --  Output 735 ml  Net -735 ml   Filed Weights   06/09/20 1554 06/10/20 0007  Weight: 83 kg 83.2 kg    Examination: General:no acute distress, elderly male  HEENT: sclera anicteric, moist mucous membranes  Neuro:PERRL, moving all extremities, AOx3  CV: irregularly irregular, s1s2, no murmurs  PULM:scattered inspiratory rhonchi/wheezing. Improved air movement GI: BS+, soft, non-tender, non-distended  Extremities: 1+ edema. 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  - Continue diuresis with lasix 4047mhis AM. Will check BMP this afternoon and consider re-doing diuretics.  - Continue antibiotics, ceftriaxone + azithormycin. Re-check procalcitoning today - Obtain sputum culture if possible - Will hold off on bronchoscopy at this time as he is better with current treatment regimen. - Will obtain repeat chest radiograph this afternoon or tomorrow morning. If opacities have improved, then the opacities would be more concerning for pulmonary  edema with rapid improvement on imaging.  - Continue anticoagulatino for a. Fib which will also treat any possible DVT/PE. Lower extremity dopplers have been ordered.  - Continue prednisone 91m, will need to determine if a prolonged taper is needed. Will be able to better decide in coming  days. - Start PPI therapy with underlying interstitial lung disease  Updated son and wife at the bedside of the plans.   Labs   CBC: Recent Labs  Lab 06/09/20 1633 06/10/20 0034 06/11/20 0556  WBC 10.1 9.9 14.4*  NEUTROABS 8.4* 8.9*  --   HGB 10.7* 10.6* 10.1*  HCT 32.7* 31.4* 31.7*  MCV 94.8 92.9 95.2  PLT 301 290 2373   Basic Metabolic Panel: Recent Labs  Lab 06/09/20 1633 06/10/20 0034 06/11/20 0556  NA 132* 129* 133*  K 4.9 5.1 4.8  CL 99 98 98  CO2 20* 21* 25  GLUCOSE 143* 213* 140*  BUN 54* 54* 57*  CREATININE 2.11* 2.04* 2.02*  CALCIUM 8.6* 8.6* 8.3*  MG 2.3  --   --    GFR: Estimated Creatinine Clearance: 29.1 mL/min (A) (by C-G formula based on SCr of 2.02 mg/dL (H)). Recent Labs  Lab 06/09/20 1633 06/10/20 0034 06/10/20 1400 06/11/20 0556  PROCALCITON  --   --  0.69  --   WBC 10.1 9.9  --  14.4*    Liver Function Tests: Recent Labs  Lab 06/09/20 1633 06/10/20 0034 06/11/20 0556  AST 663* 404* 139*  ALT 551* 495* 336*  ALKPHOS 135* 122 106  BILITOT 0.9 0.7 0.6  PROT 6.5 6.6 6.1*  ALBUMIN 2.5* 2.5* 2.4*   No results for input(s): LIPASE, AMYLASE in the last 168 hours. No results for input(s): AMMONIA in the last 168 hours.  ABG No results found for: PHART, PCO2ART, PO2ART, HCO3, TCO2, ACIDBASEDEF, O2SAT   Coagulation Profile: Recent Labs  Lab 06/09/20 1633  INR 1.8*    Cardiac Enzymes: Recent Labs  Lab 06/10/20 1[place] [facility]: [phone]  See Amion for Pager Details

## 2020-06-11 NOTE — Telephone Encounter (Signed)
I have seen this patient while in the hospital and they are requesting to follow up with me in clinic. He can be scheduled for a follow up visit with me on 1/3 if this is ok with [employer].   Thanks, Cletis Athens

## 2020-06-12 ENCOUNTER — Encounter (HOSPITAL_COMMUNITY): Payer: Self-pay | Admitting: Internal Medicine

## 2020-06-12 DIAGNOSIS — E871 Hypo-osmolality and hyponatremia: Secondary | ICD-10-CM

## 2020-06-12 DIAGNOSIS — J189 Pneumonia, unspecified organism: Secondary | ICD-10-CM

## 2020-06-12 DIAGNOSIS — N179 Acute kidney failure, unspecified: Secondary | ICD-10-CM

## 2020-06-12 DIAGNOSIS — I5043 Acute on chronic combined systolic (congestive) and diastolic (congestive) heart failure: Secondary | ICD-10-CM

## 2020-06-12 LAB — BASIC METABOLIC PANEL
Anion gap: 12 (ref 5–15)
BUN: 56 mg/dL — ABNORMAL HIGH (ref 8–23)
CO2: 24 mmol/L (ref 22–32)
Calcium: 8.2 mg/dL — ABNORMAL LOW (ref 8.9–10.3)
Chloride: 97 mmol/L — ABNORMAL LOW (ref 98–111)
Creatinine, Ser: 1.98 mg/dL — ABNORMAL HIGH (ref 0.61–1.24)
GFR, Estimated: 33 mL/min — ABNORMAL LOW (ref >60)
Glucose, Bld: 130 mg/dL — ABNORMAL HIGH (ref 70–99)
Potassium: 4.9 mmol/L (ref 3.5–5.1)
Sodium: 133 mmol/L — ABNORMAL LOW (ref 135–145)

## 2020-06-12 LAB — CBC
HCT: 33.9 % — ABNORMAL LOW (ref 39.0–52.0)
Hemoglobin: 10.8 g/dL — ABNORMAL LOW (ref 13.0–17.0)
MCH: 30.4 pg (ref 26.0–34.0)
MCHC: 31.9 g/dL (ref 30.0–36.0)
MCV: 95.5 fL (ref 80.0–100.0)
Platelets: 283 10*3/uL (ref 150–400)
RBC: 3.55 MIL/uL — ABNORMAL LOW (ref 4.22–5.81)
RDW: 13.9 % (ref 11.5–15.5)
WBC: 14.6 10*3/uL — ABNORMAL HIGH (ref 4.0–10.5)
nRBC: 0 % (ref 0.0–0.2)

## 2020-06-12 LAB — ANTINUCLEAR ANTIBODIES, IFA: ANA Ab, IFA: NEGATIVE

## 2020-06-12 LAB — SJOGRENS SYNDROME-B EXTRACTABLE NUCLEAR ANTIBODY: SSB (La) (ENA) Antibody, IgG: <0.2 AI (ref 0.0–0.9)

## 2020-06-12 LAB — SJOGRENS SYNDROME-A EXTRACTABLE NUCLEAR ANTIBODY: SSA (Ro) (ENA) Antibody, IgG: 0.3 AI (ref 0.0–0.9)

## 2020-06-12 LAB — HEPARIN LEVEL (UNFRACTIONATED): Heparin Unfractionated: 0.97 IU/mL — ABNORMAL HIGH (ref 0.30–0.70)

## 2020-06-12 MED ORDER — DILTIAZEM HCL ER COATED BEADS 120 MG PO CP24
120.0000 mg | ORAL_CAPSULE | Freq: Every day | ORAL | Status: DC
  Administered 2020-06-12: 11:00:00 120 mg via ORAL
  Filled 2020-06-12: qty 1

## 2020-06-12 MED ORDER — PREDNISONE 20 MG PO TABS
40.0000 mg | ORAL_TABLET | Freq: Every day | ORAL | Status: DC
  Administered 2020-06-12: 10:00:00 40 mg via ORAL
  Filled 2020-06-12: qty 2

## 2020-06-12 MED ORDER — FUROSEMIDE 10 MG/ML IJ SOLN
40.0000 mg | Freq: Four times a day (QID) | INTRAMUSCULAR | Status: AC
  Administered 2020-06-12 (×2): 40 mg via INTRAVENOUS
  Filled 2020-06-12 (×2): qty 4

## 2020-06-12 MED ORDER — APIXABAN 5 MG PO TABS: 5.0000 mg | ORAL_TABLET | Freq: Two times a day (BID) | ORAL | Status: DC

## 2020-06-12 MED ORDER — APIXABAN 2.5 MG PO TABS
2.5000 mg | ORAL_TABLET | Freq: Two times a day (BID) | ORAL | Status: DC
  Administered 2020-06-12 – 2020-06-14 (×5): 2.5 mg via ORAL
  Filled 2020-06-12 (×6): qty 1

## 2020-06-12 MED ORDER — QUETIAPINE FUMARATE 25 MG PO TABS
25.0000 mg | ORAL_TABLET | Freq: Every day | ORAL | Status: DC
  Administered 2020-06-12 – 2020-06-13 (×2): 25 mg via ORAL
  Filled 2020-06-12 (×2): qty 1

## 2020-06-12 MED ORDER — LORAZEPAM 0.5 MG PO TABS
0.5000 mg | ORAL_TABLET | Freq: Once | ORAL | Status: DC | PRN
  Filled 2020-06-12: qty 1

## 2020-06-12 NOTE — Evaluation (Signed)
Physical Therapy Evaluation Patient Details Name: Justin Mccarty MRN: [id number] DOB: 08-27-1935 Today's Date: 06/12/2020   History of Present Illness  Pt is 84 yo male with PMH including pulmonary fibrosis, GERD, hyponatremia, anemia, who was sent to ER by pulmonologist after O2 sats were 78%.  Pt has been admitted with afib with RVR, possible multifocal PNE, pulmonary fibrosis, and possible acute on chronic CHF.  Clinical Impression   Pt admitted with above diagnosis. Pt was able to demonstrate transfers and ambulation with supervision to min guard level and use of RW and O2.  He did present with decreased cardiopulmonary endurance, activity tolerance, and mobility.  Pt reports is normally independent in community without AD.  Today, requiring O2, RW, and only able to ambulate limited distance. Pt has good family support and a rollator at home. Pt currently with functional limitations due to the deficits listed below (see PT Problem List). Pt will benefit from skilled PT to increase their independence and safety with mobility to allow discharge to the venue listed below.       Follow Up Recommendations Home health PT;Supervision for mobility/OOB    Equipment Recommendations  Other (comment) (shower chair)    Recommendations for Other Services       Precautions / Restrictions Precautions Precautions: Other (comment) Precaution Comments: monitor O2 Restrictions Weight Bearing Restrictions: No      Mobility  Bed Mobility Overal bed mobility: Needs Assistance Bed Mobility: Supine to Sit;Sit to Supine     Supine to sit: Supervision Sit to supine: Supervision        Transfers Overall transfer level: Needs assistance Equipment used: Rolling walker (2 wheeled) Transfers: Sit to/from Stand Sit to Stand: Min guard;Supervision         General transfer comment: Min guard progressed to supervision  Ambulation/Gait Ambulation/Gait assistance: Min guard Gait Distance (Feet):  50 Feet (10' then 50') Assistive device: Rolling walker (2 wheeled) Gait Pattern/deviations: Step-through pattern;Decreased stride length Gait velocity: decreased   General Gait Details: Cues for RW proximity and pursed lip breathing; Pt ambulated 10' initially , tolerated well so increased to 59' with DOE of 3/4 and O2 sats of 95% on 4 LPM O2.  Stairs            Wheelchair Mobility    Modified Rankin (Stroke Patients Only)       Balance Overall balance assessment: Needs assistance Sitting-balance support: No upper extremity supported Sitting balance-Leahy Scale: Normal     Standing balance support: Bilateral upper extremity supported;No upper extremity supported Standing balance-Leahy Scale: Fair Standing balance comment: Static stand no AD; ambulated with RW and was steady                             Pertinent Vitals/Pain Pain Assessment: No/denies pain    Home Living Family/patient expects to be discharged to:: Private residence Living Arrangements: Spouse/significant other Available Help at Discharge: Family;Available 24 hours/day Type of Home: House Home Access: Stairs to enter Entrance Stairs-Rails: None Entrance Stairs-Number of Steps: 2 Home Layout: Able to live on main level with bedroom/bathroom;Two level (can stay on first floor but prefers shower upstairs) Home Equipment: Walker - 4 wheels;Cane - single point;Grab bars - tub/shower      Prior Function Level of Independence: Needs assistance   Gait / Transfers Assistance Needed: Can ambulate in community without AD  ADL's / Homemaking Assistance Needed: Independent with ADLs and IADLs  Comments: Reports decline over  past 2 weeks due to Shortness of breath     Hand Dominance        Extremity/Trunk Assessment   Upper Extremity Assessment Upper Extremity Assessment: Overall WFL for tasks assessed    Lower Extremity Assessment Lower Extremity Assessment: Overall WFL for tasks  assessed (ROM WFL; MMT 5/5)    Cervical / Trunk Assessment Cervical / Trunk Assessment: Normal  Communication      Cognition Arousal/Alertness: Awake/alert Behavior During Therapy: WFL for tasks assessed/performed Overall Cognitive Status: Within Functional Limits for tasks assessed                                        General Comments      Exercises     Assessment/Plan    PT Assessment Patient needs continued PT services  PT Problem List Decreased balance;Decreased mobility;Decreased activity tolerance;Decreased knowledge of use of DME;Cardiopulmonary status limiting activity       PT Treatment Interventions DME instruction;Functional mobility training;Balance training;Patient/family education;Gait training;Therapeutic activities;Stair training;Therapeutic exercise    PT Goals (Current goals can be found in the Care Plan section)  Acute Rehab PT Goals Patient Stated Goal: return home PT Goal Formulation: With patient Time For Goal Achievement: 06/26/20 Potential to Achieve Goals: Good    Frequency Min 3X/week   Barriers to discharge        Co-evaluation               AM-PAC PT "6 Clicks" Mobility  Outcome Measure Help needed turning from your back to your side while in a flat bed without using bedrails?: None Help needed moving from lying on your back to sitting on the side of a flat bed without using bedrails?: A Little Help needed moving to and from a bed to a chair (including a wheelchair)?: A Little Help needed standing up from a chair using your arms (e.g., wheelchair or bedside chair)?: A Little Help needed to walk in hospital room?: A Little Help needed climbing 3-5 steps with a railing? : A Little 6 Click Score: 19    End of Session Equipment Utilized During Treatment: Gait belt;Oxygen Activity Tolerance: Patient tolerated treatment well Patient left: in chair;with chair alarm set;with family/visitor present;with call bell/phone  within reach Nurse Communication: Mobility status PT Visit Diagnosis: Other abnormalities of gait and mobility (R26.89)    Time: 0962-8366 PT Time Calculation (min) (ACUTE ONLY): 33 min   Charges:   PT Evaluation $PT Eval Moderate Complexity: 1 Mod PT Treatments $Gait Training: 8-22 mins        Anise Salvo, PT Acute Rehab Services Pager [phone] [facility] [phone]    Rayetta Humphrey 06/12/2020, 11:34 AM

## 2020-06-12 NOTE — Progress Notes (Signed)
PROGRESS NOTE    Justin Mccarty  OEV:[id number] DOB: 10-02-1935 DOA: 06/09/2020 PCP: Ephriam Jenkins, PA   Chief Complaint  Patient presents with  . Shortness of Breath   Brief Narrative: 84 year old male with history of pulmonary fibrosis, GERD, hyponatremia, anemia sent to ED by his pulmonology for hypoxia with 78% on room air.   As per the report He has had progressive worsening of his symptoms. In the last 2 weeks he has gotten worse with cough increase pain and now he has oxygen requirement which is new. During that visit also he was noted to have irregular rhythm. He was placed on 6 L of oxygen and sent over to the ER. He is now about 90% on 2 L. His initial oxygen sat in the ER was 89%. Patient found to be in A. fib with RVR. No prior documented history of A. fib. He is therefore being admitted with new onset A. fib with RVR and worsening hypoxemia. His chest x-ray indicated right lung pneumonia but has no history of dysphagia and denied any choking. No evidence that he may have had aspiration..  ED Course: HYPOXIC sats 88% on room air > 96% 3 L. Sodium 132 potassium 4.9 chloride 99 CO2 20 glucose 143 BUN 53 creatinine 2.11 calcium 8.6 gap of 13 magnesium 2.3. Alkaline phosphatase 135 albumin 2.5 AST 663 ALT 551 total protein 6.5. BNP is 745. Troponin is 40 and then 39. White count 10.1 hemoglobin 10.7 platelets 301,PT 13.9 INR 1.8 TSH 2.645. Acute respiratory panel is negative. Chest x-ray shows interval development of multifocal infection. Likely trace left pleural effusion also. Findings in the right upper lobe right middle lobe lingula and left lower lobe. He will be admitted for treatment of right more than left pneumonia with A. fib with RVR Pulmonary and cardiology was consulted.  Patient was admitted.  Subjective:  Seen this am Alert, awake and fairly oriented.  Reports his family just left.  Assessment & Plan:  Paroxysmal A-fib with RVR, he had a prior  history of A. Fib apparently: [facility] cardiology input.  Switch him to Eliquis and Cardizem on 120.  Monitor on telemetry closely.  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  Echo shows chronic RV failure with group 2/3 pulmonary hypertension, lower extremity duplex no DVT.  With muscular deconditioning having respiratory issues.  Appreciate pulmonary input continue on aggressive IV diuresis with Lasix, steroid prednisone now on 40 mg/day with plan for 2-week taper, PT OT consult  IS flutter and wean oxygen as tolerated.  Appreciate pulmonary input.  Monitor renal function.  Possible multifocal pneumonia patient met sepsis criteria on admission w/ tachycardia hr > 90, RR > 24 and, tachypnea and hypoxia: On ceftriaxone/erythromycin.RVP panel negative,had leukocytosis at 14k but also on steroid.  Procalcitonin  0.6> 0.4  Delirium related to steroids unfamiliar setting and infection.  Seroquel nightly reorient, PT OT.  AKI versus acute on CKD versus CKD: unknown  ckd stage:creatinine 06/09/2020 on admission 2.11, previously in 2010 was 1.1.  Question cardiorenal syndrome, creatinine down at 1.9.  Monitor closely with diuresis Recent Labs  Lab 06/09/20 1633 06/10/20 0034 06/11/20 0556 06/11/20 1428 06/12/20 0753  BUN 54* 54* 57* 56* 56*  CREATININE 2.11* 2.04* 2.02* 2.03* 1.98*   Acute on chronic combined systolic and diastolic CHF, EF 82/99 echo preserved 5 to 50% felt to be volume  overloaded with flattened interventricular septum therefore on IV Lasix.  Had significant ankle edema.  Continue diuresis appreciate cardiology input, weight at 183 pound up from 182 monitor intake output, daily weight and monitor renal function closely  Transaminitis improving.  Likely in the setting of CHF congestive  hepatopathy.  Acute Hepatitis panel negative  Hyponatremia monitor sodium, likely secondary to SIADH.  Improving with diuresis.  Nutrition: Diet Order            Diet Heart Room service appropriate? No; Fluid consistency: Thin  Diet effective now                 Body mass index is 28.73 kg/m.  DVT prophylaxis: apixaban (ELIQUIS) tablet 2.5 mg Start: 06/12/20 1115 Code Status:   Code Status: Full Code  Family Communication: plan of care discussed with patient at bedside.  Status is: Inpatient Remains inpatient appropriate because:IV treatments appropriate due to intensity of illness or inability to take PO and Inpatient level of care appropriate due to severity of illness  Dispo: The patient is from: Home              Anticipated d/c is to: Home              Anticipated d/c date is: 2 days              Patient currently is not medically stable to d/c.  Consultants:see note  Procedures:see note  Culture/Microbiology    Component Value Date/Time   SDES BLOOD LEFT HAND 06/10/2020 0054   SPECREQUEST  06/10/2020 0054    BOTTLES DRAWN AEROBIC AND ANAEROBIC Blood Culture results may not be optimal due to an inadequate volume of blood received in culture bottles   CULT  06/10/2020 0054    NO GROWTH 1 DAY Performed at [facility], [place] [address] [place], [place] [address]    REPTSTATUS PENDING 06/10/2020 0240    Other culture-see note  Medications: Scheduled Meds: . apixaban  2.5 mg Oral BID  . budesonide (PULMICORT) nebulizer solution  0.25 mg Nebulization BID  . diltiazem  120 mg Oral Daily  . furosemide  40 mg Intravenous Q6H  . pantoprazole  40 mg Oral Daily  . [START ON 06/13/2020] predniSONE  40 mg Oral Q breakfast  . QUEtiapine  25 mg Oral QHS   Continuous Infusions: . azithromycin 500 mg (06/11/20 1848)  . cefTRIAXone (ROCEPHIN)  IV 2 g (06/11/20 1716)    Antimicrobials: Anti-infectives (From admission, onward)   Start     Dose/Rate Route Frequency  Ordered Stop   06/10/20 1800  cefTRIAXone (ROCEPHIN) 2 g in sodium chloride 0.9 % 100 mL IVPB        2 g 200 mL/hr over 30 Minutes Intravenous Every 24 hours 06/10/20 0003 06/15/20 1759   06/10/20 1800  azithromycin (ZITHROMAX) 500 mg in sodium chloride 0.9 % 250 mL IVPB        500 mg 250 mL/hr over 60 Minutes Intravenous Every 24 hours 06/10/20 0003 06/15/20 1759   06/09/20 1800  cefTRIAXone (ROCEPHIN) 1 g in sodium chloride 0.9 % 100 mL IVPB        1 g 200 mL/hr over 30 Minutes Intravenous  Once 06/09/20 1757 06/09/20 1906   06/09/20 1800  azithromycin (ZITHROMAX) 500 mg in sodium chloride 0.9 % 250 mL IVPB        500 mg 250 mL/hr over 60 Minutes Intravenous  Once 06/09/20 1757 06/09/20 1924  Objective: Vitals: Today's Vitals   06/11/20 2127 06/12/20 0000 06/12/20 0758 06/12/20 0900  BP:  111/79  111/62  Pulse:  84  69  Resp:  18  20  Temp:    (!) 97 F (36.1 C)  TempSrc:    Axillary  SpO2: 95%   100%  Weight:      Height:      PainSc:   0-No pain     Intake/Output Summary (Last 24 hours) at 06/12/2020 1029 Last data filed at 06/12/2020 0935 Gross per 24 hour  Intake --  Output 1925 ml  Net -1925 ml   Filed Weights   06/09/20 1554 06/10/20 0007  Weight: 83 kg 83.2 kg   Weight change:   Intake/Output from previous day: 12/12 0701 - 12/13 0700 In: -  Out: 4562 [Urine:1725] Intake/Output this shift: Total I/O In: -  Out: 450 [Urine:450]  Examination: General exam: AAO ,NAD, weak appearing. HEENT:Oral mucosa moist, Ear/Nose WNL grossly,dentition normal. Respiratory system: bilaterally basal crackles,no wheezing or crackles,no use of accessory muscle, non tender. Cardiovascular system: S1 & S2 +, regular, No JVD. Gastrointestinal system: Abdomen soft, NT,ND, BS+. Nervous System:Alert, awake, moving extremities and grossly nonfocal Extremities: Bilateral ankle edema, distal peripheral pulses palpable.  Skin: No rashes,no icterus. MSK: Normal muscle  bulk,tone, power  Data Reviewed: I have personally reviewed following labs and imaging studies CBC: Recent Labs  Lab 06/09/20 1633 06/10/20 0034 06/11/20 0556 06/12/20 0628  WBC 10.1 9.9 14.4* 14.6*  NEUTROABS 8.4* 8.9*  --   --   HGB 10.7* 10.6* 10.1* 10.8*  HCT 32.7* 31.4* 31.7* 33.9*  MCV 94.8 92.9 95.2 95.5  PLT 301 290 285 563   Basic Metabolic Panel: Recent Labs  Lab 06/09/20 1633 06/10/20 0034 06/11/20 0556 06/11/20 1428 06/12/20 0753  NA 132* 129* 133* 134* 133*  K 4.9 5.1 4.8 4.7 4.9  CL 99 98 98 97* 97*  CO2 20* 21* 25 23 24   GLUCOSE 143* 213* 140* 192* 130*  BUN 54* 54* 57* 56* 56*  CREATININE 2.11* 2.04* 2.02* 2.03* 1.98*  CALCIUM 8.6* 8.6* 8.3* 8.4* 8.2*  MG 2.3  --  2.2  --   --    GFR: Estimated Creatinine Clearance: 29.7 mL/min (A) (by C-G formula based on SCr of 1.98 mg/dL (H)). Liver Function Tests: Recent Labs  Lab 06/09/20 1633 06/10/20 0034 06/11/20 0556  AST 663* 404* 139*  ALT 551* 495* 336*  ALKPHOS 135* 122 106  BILITOT 0.9 0.7 0.6  PROT 6.5 6.6 6.1*  ALBUMIN 2.5* 2.5* 2.4*   No results for input(s): LIPASE, AMYLASE in the last 168 hours. No results for input(s): AMMONIA in the last 168 hours. Coagulation Profile: Recent Labs  Lab 06/09/20 1633  INR 1.8*   Cardiac Enzymes: Recent Labs  Lab 06/10/20 1503  CKTOTAL 42*   BNP (last 3 results) No results for input(s): PROBNP in the last 8760 hours. HbA1C: No results for input(s): HGBA1C in the last 72 hours. CBG: No results for input(s): GLUCAP in the last 168 hours. Lipid Profile: No results for input(s): CHOL, HDL, LDLCALC, TRIG, CHOLHDL, LDLDIRECT in the last 72 hours. Thyroid Function Tests: Recent Labs    06/10/20 1652  TSH 1.449   Anemia Panel: No results for input(s): VITAMINB12, FOLATE, FERRITIN, TIBC, IRON, RETICCTPCT in the last 72 hours. Sepsis Labs: Recent Labs  Lab 06/10/20 1400 06/11/20 1058  PROCALCITON 0.69 0.47    Recent Results (from the past  240 hour(s))  Resp  Panel by RT-PCR (Flu A&B, Covid) Nasopharyngeal Swab     Status: None   Collection Time: 06/09/20  9:25 PM   Specimen: Nasopharyngeal Swab; Nasopharyngeal(NP) swabs in vial transport medium  Result Value Ref Range Status   SARS Coronavirus 2 by RT PCR NEGATIVE NEGATIVE Final    Comment: (NOTE) SARS-CoV-2 target nucleic acids are NOT DETECTED.  The SARS-CoV-2 RNA is generally detectable in upper respiratory specimens during the acute phase of infection. The lowest concentration of SARS-CoV-2 viral copies this assay can detect is 138 copies/mL. A negative result does not preclude SARS-Cov-2 infection and should not be used as the sole basis for treatment or other patient management decisions. A negative result may occur with  improper specimen collection/handling, submission of specimen other than nasopharyngeal swab, presence of viral mutation(s) within the areas targeted by this assay, and inadequate number of viral copies(<138 copies/mL). A negative result must be combined with clinical observations, patient history, and epidemiological information. The expected result is Negative.  Fact Sheet for Patients:  EntrepreneurPulse.com.au  Fact Sheet for Healthcare Providers:  IncredibleEmployment.be  This test is no t yet approved or cleared by the [place] FDA and  has been authorized for detection and/or diagnosis of SARS-CoV-2 by FDA under an Emergency Use Authorization (EUA). This EUA will remain  in effect (meaning this test can be used) for the duration of the COVID-19 declaration under Section 564(b)(1) of the Act, 21 U.S.C.section 360bbb-3(b)(1), unless the authorization is terminated  or revoked sooner.       Influenza A by PCR NEGATIVE NEGATIVE Final   Influenza B by PCR NEGATIVE NEGATIVE Final    Comment: (NOTE) The Xpert Xpress SARS-CoV-2/FLU/RSV plus assay is intended as an aid in the diagnosis of influenza  from Nasopharyngeal swab specimens and should not be used as a sole basis for treatment. Nasal washings and aspirates are unacceptable for Xpert Xpress SARS-CoV-2/FLU/RSV testing.  Fact Sheet for Patients: EntrepreneurPulse.com.au  Fact Sheet for Healthcare Providers: IncredibleEmployment.be  This test is not yet approved or cleared by the [place] FDA and has been authorized for detection and/or diagnosis of SARS-CoV-2 by FDA under an Emergency Use Authorization (EUA). This EUA will remain in effect (meaning this test can be used) for the duration of the COVID-19 declaration under Section 564(b)(1) of the Act, 21 U.S.C. section 360bbb-3(b)(1), unless the authorization is terminated or revoked.  Performed at [facility], [place] [address] [place], [place] [address]   Culture, blood (routine x 2) Call MD if unable to obtain prior to antibiotics being given     Status: None (Preliminary result)   Collection Time: 06/10/20 12:34 AM   Specimen: BLOOD RIGHT HAND  Result Value Ref Range Status   Specimen Description BLOOD RIGHT HAND  Final   Special Requests AEROBIC BOTTLE ONLY Blood Culture adequate volume  Final   Culture   Final    NO GROWTH 1 DAY Performed at [facility], [place] [address], [place], [place] [address]    Report Status PENDING  Incomplete  Culture, blood (routine x 2) Call MD if unable to obtain prior to antibiotics being given     Status: None (Preliminary result)   Collection Time: 06/10/20 12:54 AM   Specimen: BLOOD LEFT HAND  Result Value Ref Range Status   Specimen Description BLOOD LEFT HAND  Final   Special Requests   Final    BOTTLES DRAWN AEROBIC AND ANAEROBIC Blood Culture results may not be optimal due  to an inadequate volume of blood received in culture bottles   Culture   Final    NO GROWTH 1 DAY Performed at [facility], [place] [address] [place], [place] [address]    Report Status PENDING   Incomplete  Respiratory Panel by PCR     Status: None   Collection Time: July 08, 2020  1:41 PM   Specimen: Nasopharyngeal Swab; Respiratory  Result Value Ref Range Status   Adenovirus NOT DETECTED NOT DETECTED Final   Coronavirus 229E NOT DETECTED NOT DETECTED Final    Comment: (NOTE) The Coronavirus on the Respiratory Panel, DOES NOT test for the novel  Coronavirus (2019 nCoV)    Coronavirus HKU1 NOT DETECTED NOT DETECTED Final   Coronavirus NL63 NOT DETECTED NOT DETECTED Final   Coronavirus OC43 NOT DETECTED NOT DETECTED Final   Metapneumovirus NOT DETECTED NOT DETECTED Final   Rhinovirus / Enterovirus NOT DETECTED NOT DETECTED Final   Influenza A NOT DETECTED NOT DETECTED Final   Influenza B NOT DETECTED NOT DETECTED Final   Parainfluenza Virus 1 NOT DETECTED NOT DETECTED Final   Parainfluenza Virus 2 NOT DETECTED NOT DETECTED Final   Parainfluenza Virus 3 NOT DETECTED NOT DETECTED Final   Parainfluenza Virus 4 NOT DETECTED NOT DETECTED Final   Respiratory Syncytial Virus NOT DETECTED NOT DETECTED Final   Bordetella pertussis NOT DETECTED NOT DETECTED Final   Bordetella Parapertussis NOT DETECTED NOT DETECTED Final   Chlamydophila pneumoniae NOT DETECTED NOT DETECTED Final   Mycoplasma pneumoniae NOT DETECTED NOT DETECTED Final    Comment: Performed at [facility], [address], [place], [place] [address]  MRSA PCR Screening     Status: None   Collection Time: 06/11/20  9:32 AM   Specimen: Nasal Mucosa; Nasopharyngeal  Result Value Ref Range Status   MRSA by PCR NEGATIVE NEGATIVE Final    Comment:        The GeneXpert MRSA Assay (FDA approved for NASAL specimens only), is one component of a comprehensive MRSA colonization surveillance program. It is not intended to diagnose MRSA infection nor to guide or monitor treatment for MRSA infections. Performed at [facility], [place] [address], [place], [place] [address]      Radiology Studies: US RENAL  Result  Date: 07/08/20 CLINICAL DATA:  AK I EXAM: RENAL / URINARY TRACT ULTRASOUND COMPLETE COMPARISON:  None. FINDINGS: Right Kidney: Renal measurements: 9.6 x 5.5 x 3.8 cm = volume: 105 mL. No definite hydronephrosis. There is an exophytic cyst measuring 1.7 cm. Left Kidney: Renal measurements: 11.0 x 5.8 x 5.4 cm = volume: 180 mL. No definite hydronephrosis or renal mass. Bladder: Appears normal for degree of bladder distention. Other: None. IMPRESSION: Exam limited by shadowing bowel gas and patient body habitus. Within these limitations no acute finding in the bilateral kidneys. Right renal cyst. Electronically Signed   By: Audie Pinto M.D.   On: 2020-07-08 18:14   ECHOCARDIOGRAM COMPLETE  Result Date: July 08, 2020    ECHOCARDIOGRAM REPORT   Patient Name:   Justin Mccarty Date of Exam: 08-Jul-2020 Medical Rec #:  [id number]     Height:       67.0 in Accession #:    [id number]    Weight:       183.4 lb Date of Birth:  Nov 01, 1935      BSA:          1.949 m Patient Age:    51 years      BP:  118/61 mmHg Patient Gender: M             HR:           78 bpm. Exam Location:  Inpatient Procedure: 2D Echo, Cardiac Doppler and Color Doppler Indications:    Atrial fibrillation  History:        Patient has no prior history of Echocardiogram examinations.                 Arrythmias:Atrial Fibrillation.  Sonographer:    Clayton Lefort RDCS (AE) Referring Phys: [id number] [facility] Julious Oka  Sonographer Comments: Image acquisition challenging due to respiratory motion. IMPRESSIONS  1. Left ventricular ejection fraction, by estimation, is 45 to 50%. The left ventricle has mildly decreased function. The left ventricle demonstrates global hypokinesis. There is moderate concentric left ventricular hypertrophy. Left ventricular diastolic function could not be evaluated. There is the interventricular septum is flattened in systole and diastole, consistent with right ventricular pressure and volume overload.  2. Right ventricular  systolic function is moderately reduced. The right ventricular size is moderately enlarged. There is moderately elevated pulmonary artery systolic pressure. The estimated right ventricular systolic pressure is 82.8 mmHg.  3. Left atrial size was mildly dilated.  4. Right atrial size was moderately dilated.  5. The mitral valve is normal in structure. No evidence of mitral valve regurgitation. No evidence of mitral stenosis.  6. The aortic valve is normal in structure. Aortic valve regurgitation is mild. Mild to moderate aortic valve sclerosis/calcification is present, without any evidence of aortic stenosis.  7. The inferior vena cava is dilated in size with <50% respiratory variability, suggesting right atrial pressure of 15 mmHg. FINDINGS  Left Ventricle: Left ventricular ejection fraction, by estimation, is 45 to 50%. The left ventricle has mildly decreased function. The left ventricle demonstrates global hypokinesis. The left ventricular internal cavity size was normal in size. There is  moderate concentric left ventricular hypertrophy. The interventricular septum is flattened in systole and diastole, consistent with right ventricular pressure and volume overload. Left ventricular diastolic function could not be evaluated due to atrial fibrillation. Left ventricular diastolic function could not be evaluated. Right Ventricle: The right ventricular size is moderately enlarged. No increase in right ventricular wall thickness. Right ventricular systolic function is moderately reduced. There is moderately elevated pulmonary artery systolic pressure. The tricuspid  regurgitant velocity is 2.86 m/s, and with an assumed right atrial pressure of 15 mmHg, the estimated right ventricular systolic pressure is 00.3 mmHg. Left Atrium: Left atrial size was mildly dilated. Right Atrium: Right atrial size was moderately dilated. Pericardium: There is no evidence of pericardial effusion. Mitral Valve: The mitral valve is normal  in structure. There is mild thickening of the mitral valve leaflet(s). There is mild calcification of the mitral valve leaflet(s). Mild mitral annular calcification. No evidence of mitral valve regurgitation. No evidence of mitral valve stenosis. Tricuspid Valve: The tricuspid valve is normal in structure. Tricuspid valve regurgitation is mild . No evidence of tricuspid stenosis. Aortic Valve: The aortic valve is normal in structure. Aortic valve regurgitation is mild. Aortic regurgitation PHT measures 988 msec. Mild to moderate aortic valve sclerosis/calcification is present, without any evidence of aortic stenosis. Aortic valve  mean gradient measures 2.0 mmHg. Aortic valve peak gradient measures 3.6 mmHg. Aortic valve area, by VTI measures 2.88 cm. Pulmonic Valve: The pulmonic valve was normal in structure. Pulmonic valve regurgitation is not visualized. No evidence of pulmonic stenosis. Aorta: The aortic root is normal in size  and structure. Venous: The inferior vena cava is dilated in size with less than 50% respiratory variability, suggesting right atrial pressure of 15 mmHg. IAS/Shunts: No atrial level shunt detected by color flow Doppler.  LEFT VENTRICLE PLAX 2D LVIDd:         5.20 cm LVIDs:         4.00 cm LV PW:         1.30 cm LV IVS:        1.40 cm LVOT diam:     2.00 cm LV SV:         46 LV SV Index:   24 LVOT Area:     3.14 cm  RIGHT VENTRICLE            IVC RV Basal diam:  3.30 cm    IVC diam: 2.50 cm RV S prime:     8.79 cm/s TAPSE (M-mode): 1.2 cm LEFT ATRIUM             Index       RIGHT ATRIUM           Index LA diam:        4.10 cm 2.10 cm/m  RA Area:     17.80 cm LA Vol (A2C):   46.2 ml 23.70 ml/m RA Volume:   43.40 ml  22.27 ml/m LA Vol (A4C):   61.1 ml 31.35 ml/m LA Biplane Vol: 58.8 ml 30.17 ml/m  AORTIC VALVE AV Area (Vmax):    2.62 cm AV Area (Vmean):   2.49 cm AV Area (VTI):     2.88 cm AV Vmax:           95.18 cm/s AV Vmean:          70.025 cm/s AV VTI:            0.161 m AV  Peak Grad:      3.6 mmHg AV Mean Grad:      2.0 mmHg LVOT Vmax:         79.48 cm/s LVOT Vmean:        55.500 cm/s LVOT VTI:          0.148 m LVOT/AV VTI ratio: 0.92 AI PHT:            988 msec  AORTA Ao Root diam: 3.50 cm Ao Asc diam:  3.30 cm TRICUSPID VALVE TR Peak grad:   32.7 mmHg TR Vmax:        286.00 cm/s  SHUNTS Systemic VTI:  0.15 m Systemic Diam: 2.00 cm Ena Dawley MD Electronically signed by Ena Dawley MD Signature Date/Time: 06/10/2020/1:51:20 PM    Final    VAS [place] LOWER EXTREMITY VENOUS (DVT)  Result Date: 06/11/2020  Lower Venous DVT Study Indications: Edema.  Risk Factors: Atrial fibrillation. Comparison Study: No prior study on file Performing Technologist: Sharion Dove RVS  Examination Guidelines: A complete evaluation includes B-mode imaging, spectral Doppler, color Doppler, and power Doppler as needed of all accessible portions of each vessel. Bilateral testing is considered an integral part of a complete examination. Limited examinations for reoccurring indications may be performed as noted. The reflux portion of the exam is performed with the patient in reverse Trendelenburg.  +---------+---------------+---------+-----------+----------+--------------+ RIGHT    CompressibilityPhasicitySpontaneityPropertiesThrombus Aging +---------+---------------+---------+-----------+----------+--------------+ CFV      Full  pulsatile      +---------+---------------+---------+-----------+----------+--------------+ SFJ      Full                                                        +---------+---------------+---------+-----------+----------+--------------+ FV Prox  Full                                                        +---------+---------------+---------+-----------+----------+--------------+ FV Mid   Full                                                         +---------+---------------+---------+-----------+----------+--------------+ FV DistalFull                                                        +---------+---------------+---------+-----------+----------+--------------+ PFV      Full                                                        +---------+---------------+---------+-----------+----------+--------------+ POP      Full                                         pulsatile      +---------+---------------+---------+-----------+----------+--------------+ PTV      Full                                                        +---------+---------------+---------+-----------+----------+--------------+ PERO     Full                                                        +---------+---------------+---------+-----------+----------+--------------+   +---------+---------------+---------+-----------+----------+--------------+ LEFT     CompressibilityPhasicitySpontaneityPropertiesThrombus Aging +---------+---------------+---------+-----------+----------+--------------+ CFV      Full                                         pulsatile      +---------+---------------+---------+-----------+----------+--------------+ SFJ      Full                                                        +---------+---------------+---------+-----------+----------+--------------+  FV Prox  Full                                                        +---------+---------------+---------+-----------+----------+--------------+ FV Mid   Full                                                        +---------+---------------+---------+-----------+----------+--------------+ FV DistalFull                                                        +---------+---------------+---------+-----------+----------+--------------+ PFV      Full                                                         +---------+---------------+---------+-----------+----------+--------------+ POP      Full                                         pulsatile      +---------+---------------+---------+-----------+----------+--------------+ PTV      Full                                                        +---------+---------------+---------+-----------+----------+--------------+ PERO     Full                                                        +---------+---------------+---------+-----------+----------+--------------+     Summary: RIGHT: - There is no evidence of deep vein thrombosis in the lower extremity.  LEFT: - There is no evidence of deep vein thrombosis in the lower extremity.  *See table(s) above for measurements and observations.    Preliminary      LOS: 3 days   Antonieta Pert, MD [facility]  06/12/2020, 10:29 AM

## 2020-06-12 NOTE — Progress Notes (Signed)
NAME:  Justin Mccarty, MRN:  [id number], DOB:  January 17, 1936, LOS: 3 ADMISSION DATE:  06/09/2020, CONSULTATION DATE:  06/10/20 REFERRING MD:  Justin Allan, MD CHIEF COMPLAINT:  Shortness of breath   Brief History   Justin Mccarty is an 84 year old male, never smoker with history of GERD who has been following in pulmonary clinic for cough and shortness of breath over recent months. He has been diagnosed with non-specific interstitial pneumonia based on CT imaging in July and October with pulmonary function tests showing mild restrictive defect and mild diffusion defect.     Inflammatory workup is negative for ANA, RA, non elevated ESR and negative hypersensitivity panel. 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       He has continued to have progressive symptoms over recent months and comes in acute short of breath over the past 2 weeks. He has new oxygen requirement 2-3L at rest. He has productive cough at this time when it is typically dry.     He has been found to be in atrial fibrillation with RVR requiring diltiazem drip.     Chest radiograph is concerning for new scattered bilateral opacities.    BNP is 745, LDH 279, Cr 2.04 (baseline 1.4).    Echo shows EF 45-50%, global hypokinesis. RV systolic function is mildly reduced. RV size is moderately enlarged. Moderately elevated RVSP at 47.11mHg. Left atrial size mildly dilated. Right atrial size was moderately dilated.    Consults:  Cardiology PCCM  Procedures:  n/a  Significant Diagnostic Tests:  CXR 12/10 - interval development of multifocal airspace opacities bilaterally Procalcitonin 12/11 - 0.69 CRP 12.7   Micro Data:  Extended Respiratory Pathogen Panel 12/11 - Negative   Antimicrobials:  Ceftriaxone 12/10>>  Azithromycin 12/10>>  Interim history/subjective:  Continues to slowly improve. Cough now productive. Remains weak and has some confusion issues at  night.  Objective   Blood pressure 111/79, pulse 84, temperature (!) 97.5 F (36.4 C), temperature source Oral, resp. rate 18, height _0  (1.702 m), weight 83.2 kg, SpO2 95 %.        Intake/Output Summary (Last 24 hours) at 06/12/2020 0813 Last data filed at 06/12/2020 0400 Gross per 24 hour  Intake --  Output 1475 ml  Net -1475 ml   Filed Weights   06/09/20 1554 06/10/20 0007  Weight: 83 kg 83.2 kg    Examination: Constitutional: frail elderly man in NAD  Eyes: EOMI, pupils equal Ears, nose, mouth, and throat: MMM, trachea midline Cardiovascular: irregular, ext warm Respiratory: crackles and wheezing worse on right, no accessory muscle use Gastrointestinal: Soft, +BS Skin: No rashes, normal turgor Neurologic: globally weak but able to sit up on own, moves all 4 limbs to command Psychiatric: RASS 0, fair insight Ext: 2+ edema persists   Resolved Hospital Problem list     Assessment:  Acute Hypoxemic Respiratory Failure -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 -There are worsening infiltrates on CXR, differential infection/fluid/inflammation, all 3 being treated -Echo, labs most c/w acute on chronic RV failure with group 2/3 pulmonary HTN.  LE duplex benign. - Muscular deconditioning not helping  Delirium- related to steroids and unfamiliar setting  Afib- related to RV failure, volume overload, respiratory issues  AKI on CKD- question cardiorenal  Plan:  - Push diuresis as tolerated by renal function - Check CXR, rapid improvement would argue against infection - Re-orient, start qHS seroquel - Drop prednisone to 452mday, will do  a 2 week taper - Wean O2 to maintain sats > 90% - PT consult, IS/flutter - AC and rate control per cardiology - Will follow with you  Justin Emery MD PCCM

## 2020-06-12 NOTE — Progress Notes (Addendum)
Progress Note  Patient Name: Justin Mccarty Date of Encounter: 06/12/2020  Primary Cardiologist: Dr. Johney Frame  Subjective   Breathing improved today. No chest pain. Remains in AF. If no plans for invasive pulmonary testing, could consider transitioning to PO dosing   Inpatient Medications    Scheduled Meds: . budesonide (PULMICORT) nebulizer solution  0.25 mg Nebulization BID  . pantoprazole  40 mg Oral Daily  . predniSONE  60 mg Oral Q breakfast   Continuous Infusions: . azithromycin 500 mg (06/11/20 1848)  . cefTRIAXone (ROCEPHIN)  IV 2 g (06/11/20 1716)  . diltiazem (CARDIZEM) infusion 5 mg/hr (06/11/20 0659)  . heparin 1,650 Units/hr (06/11/20 1715)   PRN Meds: guaiFENesin-dextromethorphan, levalbuterol, LORazepam   Vital Signs    Vitals:   06/11/20 1014 06/11/20 1725 06/11/20 2127 06/12/20 0000  BP:  115/67  111/79  Pulse:  88  84  Resp:    18  Temp:      TempSrc:      SpO2: (!) 79% 97% 95%   Weight:      Height:        Intake/Output Summary (Last 24 hours) at 06/12/2020 0741 Last data filed at 06/12/2020 0400 Gross per 24 hour  Intake --  Output 1475 ml  Net -1475 ml   Filed Weights   06/09/20 1554 06/10/20 0007  Weight: 83 kg 83.2 kg    Physical Exam   General: Elderly, NAD Neck: Negative for carotid bruits. No JVD Lungs: Bilateral upper and lower wheezes. Breathing is unlabored. Cardiovascular: Irregularly irregular. No murmurs Abdomen: Soft, non-tender, non-distended. No obvious abdominal masses. Extremities: 2+ BLE pitting edema. Radial pulses 2+ bilaterally Neuro: Alert and oriented. No focal deficits. No facial asymmetry. MAE spontaneously. Psych: Responds to questions appropriately with normal affect.    Labs    Chemistry Recent Labs  Lab 06/09/20 1633 06/10/20 0034 06/11/20 0556 06/11/20 1428  NA 132* 129* 133* 134*  K 4.9 5.1 4.8 4.7  CL 99 98 98 97*  CO2 20* 21* 25 23  GLUCOSE 143* 213* 140* 192*  BUN 54* 54* 57* 56*   CREATININE 2.11* 2.04* 2.02* 2.03*  CALCIUM 8.6* 8.6* 8.3* 8.4*  PROT 6.5 6.6 6.1*  --   ALBUMIN 2.5* 2.5* 2.4*  --   AST 663* 404* 139*  --   ALT 551* 495* 336*  --   ALKPHOS 135* 122 106  --   BILITOT 0.9 0.7 0.6  --   GFRNONAA 31* 32* 32* 32*  ANIONGAP 13 10 10 14      Hematology Recent Labs  Lab 06/10/20 0034 06/11/20 0556 06/12/20 0628  WBC 9.9 14.4* 14.6*  RBC 3.38* 3.33* 3.55*  HGB 10.6* 10.1* 10.8*  HCT 31.4* 31.7* 33.9*  MCV 92.9 95.2 95.5  MCH 31.4 30.3 30.4  MCHC 33.8 31.9 31.9  RDW 13.4 13.6 13.9  PLT 290 285 283    Cardiac EnzymesNo results for input(s): TROPONINI in the last 168 hours. No results for input(s): TROPIPOC in the last 168 hours.   BNP Recent Labs  Lab 06/09/20 1634  BNP 745.0*     DDimer No results for input(s): DDIMER in the last 168 hours.   Radiology    14/10/21 RENAL  Result Date: 06/10/2020 CLINICAL DATA:  AK I EXAM: RENAL / URINARY TRACT ULTRASOUND COMPLETE COMPARISON:  None. FINDINGS: Right Kidney: Renal measurements: 9.6 x 5.5 x 3.8 cm = volume: 105 mL. No definite hydronephrosis. There is an exophytic cyst measuring 1.7  cm. Left Kidney: Renal measurements: 11.0 x 5.8 x 5.4 cm = volume: 180 mL. No definite hydronephrosis or renal mass. Bladder: Appears normal for degree of bladder distention. Other: None. IMPRESSION: Exam limited by shadowing bowel gas and patient body habitus. Within these limitations no acute finding in the bilateral kidneys. Right renal cyst. Electronically Signed   By: Emmaline KluverNancy  Ballantyne M.D.   On: 06/10/2020 18:14   ECHOCARDIOGRAM COMPLETE  Result Date: 06/10/2020    ECHOCARDIOGRAM REPORT   Patient Name:   Dorothyann PengARL W Campos Date of Exam: 06/10/2020 Medical Rec #:  [id number]     Height:       67.0 in Accession #:    [id number]    Weight:       183.4 lb Date of Birth:  10/27/1935      BSA:          1.949 m Patient Age:    84 years      BP:           118/61 mmHg Patient Gender: M             HR:           78 bpm. Exam  Location:  Inpatient Procedure: 2D Echo, Cardiac Doppler and Color Doppler Indications:    Atrial fibrillation  History:        Patient has no prior history of Echocardiogram examinations.                 Arrythmias:Atrial Fibrillation.  Sonographer:    Ross LudwigArthur Guy RDCS (AE) Referring Phys: [id number] [facility] Jerelyn CharlesL GARBA  Sonographer Comments: Image acquisition challenging due to respiratory motion. IMPRESSIONS  1. Left ventricular ejection fraction, by estimation, is 45 to 50%. The left ventricle has mildly decreased function. The left ventricle demonstrates global hypokinesis. There is moderate concentric left ventricular hypertrophy. Left ventricular diastolic function could not be evaluated. There is the interventricular septum is flattened in systole and diastole, consistent with right ventricular pressure and volume overload.  2. Right ventricular systolic function is moderately reduced. The right ventricular size is moderately enlarged. There is moderately elevated pulmonary artery systolic pressure. The estimated right ventricular systolic pressure is 47.7 mmHg.  3. Left atrial size was mildly dilated.  4. Right atrial size was moderately dilated.  5. The mitral valve is normal in structure. No evidence of mitral valve regurgitation. No evidence of mitral stenosis.  6. The aortic valve is normal in structure. Aortic valve regurgitation is mild. Mild to moderate aortic valve sclerosis/calcification is present, without any evidence of aortic stenosis.  7. The inferior vena cava is dilated in size with <50% respiratory variability, suggesting right atrial pressure of 15 mmHg. FINDINGS  Left Ventricle: Left ventricular ejection fraction, by estimation, is 45 to 50%. The left ventricle has mildly decreased function. The left ventricle demonstrates global hypokinesis. The left ventricular internal cavity size was normal in size. There is  moderate concentric left ventricular hypertrophy. The interventricular septum is  flattened in systole and diastole, consistent with right ventricular pressure and volume overload. Left ventricular diastolic function could not be evaluated due to atrial fibrillation. Left ventricular diastolic function could not be evaluated. Right Ventricle: The right ventricular size is moderately enlarged. No increase in right ventricular wall thickness. Right ventricular systolic function is moderately reduced. There is moderately elevated pulmonary artery systolic pressure. The tricuspid  regurgitant velocity is 2.86 m/s, and with an assumed right atrial pressure of 15 mmHg, the estimated right ventricular systolic  pressure is 47.7 mmHg. Left Atrium: Left atrial size was mildly dilated. Right Atrium: Right atrial size was moderately dilated. Pericardium: There is no evidence of pericardial effusion. Mitral Valve: The mitral valve is normal in structure. There is mild thickening of the mitral valve leaflet(s). There is mild calcification of the mitral valve leaflet(s). Mild mitral annular calcification. No evidence of mitral valve regurgitation. No evidence of mitral valve stenosis. Tricuspid Valve: The tricuspid valve is normal in structure. Tricuspid valve regurgitation is mild . No evidence of tricuspid stenosis. Aortic Valve: The aortic valve is normal in structure. Aortic valve regurgitation is mild. Aortic regurgitation PHT measures 988 msec. Mild to moderate aortic valve sclerosis/calcification is present, without any evidence of aortic stenosis. Aortic valve  mean gradient measures 2.0 mmHg. Aortic valve peak gradient measures 3.6 mmHg. Aortic valve area, by VTI measures 2.88 cm. Pulmonic Valve: The pulmonic valve was normal in structure. Pulmonic valve regurgitation is not visualized. No evidence of pulmonic stenosis. Aorta: The aortic root is normal in size and structure. Venous: The inferior vena cava is dilated in size with less than 50% respiratory variability, suggesting right atrial pressure  of 15 mmHg. IAS/Shunts: No atrial level shunt detected by color flow Doppler.  LEFT VENTRICLE PLAX 2D LVIDd:         5.20 cm LVIDs:         4.00 cm LV PW:         1.30 cm LV IVS:        1.40 cm LVOT diam:     2.00 cm LV SV:         46 LV SV Index:   24 LVOT Area:     3.14 cm  RIGHT VENTRICLE            IVC RV Basal diam:  3.30 cm    IVC diam: 2.50 cm RV S prime:     8.79 cm/s TAPSE (M-mode): 1.2 cm LEFT ATRIUM             Index       RIGHT ATRIUM           Index LA diam:        4.10 cm 2.10 cm/m  RA Area:     17.80 cm LA Vol (A2C):   46.2 ml 23.70 ml/m RA Volume:   43.40 ml  22.27 ml/m LA Vol (A4C):   61.1 ml 31.35 ml/m LA Biplane Vol: 58.8 ml 30.17 ml/m  AORTIC VALVE AV Area (Vmax):    2.62 cm AV Area (Vmean):   2.49 cm AV Area (VTI):     2.88 cm AV Vmax:           95.18 cm/s AV Vmean:          70.025 cm/s AV VTI:            0.161 m AV Peak Grad:      3.6 mmHg AV Mean Grad:      2.0 mmHg LVOT Vmax:         79.48 cm/s LVOT Vmean:        55.500 cm/s LVOT VTI:          0.148 m LVOT/AV VTI ratio: 0.92 AI PHT:            988 msec  AORTA Ao Root diam: 3.50 cm Ao Asc diam:  3.30 cm TRICUSPID VALVE TR Peak grad:   32.7 mmHg TR Vmax:  286.00 cm/s  SHUNTS Systemic VTI:  0.15 m Systemic Diam: 2.00 cm Tobias Alexander MD Electronically signed by Tobias Alexander MD Signature Date/Time: 06/10/2020/1:51:20 PM    Final    VAS [place] LOWER EXTREMITY VENOUS (DVT)  Result Date: 06/11/2020  Lower Venous DVT Study Indications: Edema.  Risk Factors: Atrial fibrillation. Comparison Study: No prior study on file Performing Technologist: Sherren Kerns RVS  Examination Guidelines: A complete evaluation includes B-mode imaging, spectral Doppler, color Doppler, and power Doppler as needed of all accessible portions of each vessel. Bilateral testing is considered an integral part of a complete examination. Limited examinations for reoccurring indications may be performed as noted. The reflux portion of the exam is performed  with the patient in reverse Trendelenburg.  +---------+---------------+---------+-----------+----------+--------------+ RIGHT    CompressibilityPhasicitySpontaneityPropertiesThrombus Aging +---------+---------------+---------+-----------+----------+--------------+ CFV      Full                                         pulsatile      +---------+---------------+---------+-----------+----------+--------------+ SFJ      Full                                                        +---------+---------------+---------+-----------+----------+--------------+ FV Prox  Full                                                        +---------+---------------+---------+-----------+----------+--------------+ FV Mid   Full                                                        +---------+---------------+---------+-----------+----------+--------------+ FV DistalFull                                                        +---------+---------------+---------+-----------+----------+--------------+ PFV      Full                                                        +---------+---------------+---------+-----------+----------+--------------+ POP      Full                                         pulsatile      +---------+---------------+---------+-----------+----------+--------------+ PTV      Full                                                        +---------+---------------+---------+-----------+----------+--------------+  PERO     Full                                                        +---------+---------------+---------+-----------+----------+--------------+   +---------+---------------+---------+-----------+----------+--------------+ LEFT     CompressibilityPhasicitySpontaneityPropertiesThrombus Aging +---------+---------------+---------+-----------+----------+--------------+ CFV      Full                                         pulsatile       +---------+---------------+---------+-----------+----------+--------------+ SFJ      Full                                                        +---------+---------------+---------+-----------+----------+--------------+ FV Prox  Full                                                        +---------+---------------+---------+-----------+----------+--------------+ FV Mid   Full                                                        +---------+---------------+---------+-----------+----------+--------------+ FV DistalFull                                                        +---------+---------------+---------+-----------+----------+--------------+ PFV      Full                                                        +---------+---------------+---------+-----------+----------+--------------+ POP      Full                                         pulsatile      +---------+---------------+---------+-----------+----------+--------------+ PTV      Full                                                        +---------+---------------+---------+-----------+----------+--------------+ PERO     Full                                                        +---------+---------------+---------+-----------+----------+--------------+  Summary: RIGHT: - There is no evidence of deep vein thrombosis in the lower extremity.  LEFT: - There is no evidence of deep vein thrombosis in the lower extremity.  *See table(s) above for measurements and observations.    Preliminary    Telemetry    06/12/20 AF with rates in the 80's - Personally Reviewed  ECG    NO new tracing as of 06/12/20- Personally Reviewed  Cardiac Studies   Echo 06/10/2020 1. Left ventricular ejection fraction, by estimation, is 45 to 50%. The  left ventricle has mildly decreased function. The left ventricle  demonstrates global hypokinesis. There is moderate concentric left  ventricular hypertrophy.  Left ventricular  diastolic function could not be evaluated. There is the interventricular  septum is flattened in systole and diastole, consistent with right  ventricular pressure and volume overload.  2. Right ventricular systolic function is moderately reduced. The right  ventricular size is moderately enlarged. There is moderately elevated  pulmonary artery systolic pressure. The estimated right ventricular  systolic pressure is 47.7 mmHg.  3. Left atrial size was mildly dilated.  4. Right atrial size was moderately dilated.  5. The mitral valve is normal in structure. No evidence of mitral valve  regurgitation. No evidence of mitral stenosis.  6. The aortic valve is normal in structure. Aortic valve regurgitation is  mild. Mild to moderate aortic valve sclerosis/calcification is present,  without any evidence of aortic stenosis.  7. The inferior vena cava is dilated in size with <50% respiratory  variability, suggesting right atrial pressure of 15 mmHg.   Patient Profile     84 y.o. male with a hx of nonspecific ILD follow by pulmonary and GERD who is being seen for the evaluation of atrial fibrillation with RVR.  Assessment & Plan    1. PAF: -Felt to be secondary to underlying ILD -Placed on IV diltiazem and IV Heparin with HR's now in the 80's  -Will transition to PO dosing at  CD and Eliquis -No BB with pulmonary disease   2. Acute hypoxic respiratory failure: -Stabilized>>>on [place] -Per pulmonary   3.  Acute on chronic combined systolic and diastolic heart failure: -Echocardiogram from 06/10/20 with LVEF at 45-50%>>he was felt to be volume overloaded flattened interventricular septum therefore he was treated with IV lasix 06/11/20 -Has significant LE/ankle edema>>>would continue IV Lasix  BID at this time>>>once more fluid volume balanced, then consider transition to PO dosing  -Weight, 183lb today>>up from 182lb 06/11/20 -I&O, net negative 2.3L -Watch  renal function closely   4. Renal insufficiency: -Creatinine, 2.03 today -Remote baseline in 2010 at 1.2 -Daily BMET while being diuresed   Signed, Georgie Chard NP-C [facility] Pager: [phone] 06/12/2020, 7:41 AM    For questions or updates, please contact   Please consult [email or web address] for contact info under Cardiology/STEMI.   Patient seen and examined.  Agree with above documentation.  On exam, patient is alert and oriented, normal rate, irregular rhythm, no murmurs, diffuse expiratory wheezing, 1+ bilateral lower extremity edema.  1700 cc urine output yesterday following IV Lasix 40 mg twice daily yesterday.  Creatinine stable at 2.0.  BP stable 121/78.  Continue IV Lasix 40 mg twice daily.  No procedures planned, will convert from IV to p.o. diltiazem.  Will switch from heparin to p.o. Eliquis.  Little Ishikawa, MD

## 2020-06-12 NOTE — Telephone Encounter (Signed)
Pt is currently established with Dr. Val Eagle at [facility]. Dr. Val Eagle, please advise if you are okay with pt seeing Dr. Francine Graven per message sent by Dr. Francine Graven after pt's recent hosp stay.

## 2020-06-12 NOTE — Telephone Encounter (Signed)
Looks like patient is a Dr. Wynona Mccarty patient not Dr. Everardo All. I can get patient scheduled with Dr. Francine Graven for follow up.   Dr. Wynona Mccarty are you ok for this patient to switch to Dr. Wynona Mccarty.    Martina Sinner, MD 21 hours ago (6:14 PM)       I have seen this patient while in the hospital and they are requesting to follow up with me in clinic. He can be scheduled for a follow up visit with me on 1/3 if this is ok with Erskine Squibb.   Thanks, Cletis Athens

## 2020-06-12 NOTE — Progress Notes (Signed)
ANTICOAGULATION CONSULT NOTE - Follow Up Consult  Pharmacy Consult for heparin > apixaban Indication: atrial fibrillation  Allergies  Allergen Reactions  . Penicillin G Rash    Patient Measurements: Height: 5\' 7"  (170.2 cm) Weight: 83.2 kg (183 lb 6.8 oz) IBW/kg (Calculated) : 66.1 Heparin Dosing Weight: 82.7 kg  Vital Signs: Temp: 97 F (36.1 C) (12/13 0900) Temp Source: Axillary (12/13 0900) BP: 111/62 (12/13 0900) Pulse Rate: 69 (12/13 0900)  Labs: Recent Labs    06/09/20 1633 06/09/20 1635 06/09/20 1832 06/10/20 0034 06/10/20 0952 06/10/20 1503 06/10/20 2049 06/11/20 0556 06/11/20 1428 06/11/20 1515 06/12/20 0628 06/12/20 0753  HGB 10.7*  --   --  10.6*  --   --   --  10.1*  --   --  10.8*  --   HCT 32.7*  --   --  31.4*  --   --   --  31.7*  --   --  33.9*  --   PLT 301  --   --  290  --   --   --  285  --   --  283  --   LABPROT 19.9*  --   --   --   --   --   --   --   --   --   --   --   INR 1.8*  --   --   --   --   --   --   --   --   --   --   --   HEPARINUNFRC  --   --   --  0.11*   < >  --    < > 0.45  --  0.59 0.97*  --   CREATININE 2.11*  --   --  2.04*  --   --   --  2.02* 2.03*  --   --  1.98*  CKTOTAL  --   --   --   --   --  42*  --   --   --   --   --   --   TROPONINIHS  --  40* 39*  --   --   --   --   --   --   --   --   --    < > = values in this interval not displayed.    Estimated Creatinine Clearance: 29.7 mL/min (A) (by C-G formula based on SCr of 1.98 mg/dL (H)).   Medications:  Medications Prior to Admission  Medication Sig Dispense Refill Last Dose  . albuterol (PROVENTIL) (2.5 MG/3ML) 0.083% nebulizer solution Take 3 mLs (2.5 mg total) by nebulization every 6 (six) hours as needed for wheezing or shortness of breath. 75 mL 1 06/09/2020 at Unknown time  . Budeson-Glycopyrrol-Formoterol (BREZTRI AEROSPHERE) 160-9-4.8 MCG/ACT AERO Inhale 2 puffs into the lungs in the morning and at bedtime. 10.7 g 1 06/09/2020 at Unknown time  .  busPIRone (BUSPAR) 5 MG tablet Take 1 tablet (5 mg total) by mouth 3 (three) times daily. 90 tablet 1 06/09/2020 at Unknown time  . Cyanocobalamin (VITAMIN B 12 PO) Take 2 capsules by mouth in the morning and at bedtime.    06/09/2020 at Unknown time  . glucosamine-chondroitin 500-400 MG tablet Take 1 tablet by mouth in the morning and at bedtime.   06/09/2020 at Unknown time  . Multiple Vitamins-Minerals (ICAPS AREDS 2 PO) Take 1 capsule by mouth in the morning and  at bedtime.   06/09/2020 at Unknown time  . Omega-3 Fatty Acids (FISH OIL PO) Take 1 capsule by mouth 2 (two) times daily.    06/09/2020 at Unknown time  . VITAMIN D PO Take 2 tablets by mouth daily.   06/09/2020 at Unknown time  . Budeson-Glycopyrrol-Formoterol (BREZTRI AEROSPHERE) 160-9-4.8 MCG/ACT AERO Inhale 1 puff into the lungs in the morning and at bedtime. (Patient not taking: No sig reported) 4.8 g 0 Not Taking at Unknown time  . HYDROcodone-homatropine (HYCODAN) 5-1.5 MG/5ML syrup Take 5 mLs by mouth every 6 (six) hours as needed for cough. 240 mL 0   . sodium chloride HYPERTONIC 3 % nebulizer solution Take by nebulization 2 (two) times daily as needed for other. (Patient not taking: No sig reported) 750 mL 1 Not Taking at Unknown time   Scheduled:  . apixaban  2.5 mg Oral BID  . budesonide (PULMICORT) nebulizer solution  0.25 mg Nebulization BID  . diltiazem  120 mg Oral Daily  . furosemide  40 mg Intravenous Q6H  . pantoprazole  40 mg Oral Daily  . [START ON 06/13/2020] predniSONE  40 mg Oral Q breakfast  . QUEtiapine  25 mg Oral QHS   Infusions:  . azithromycin 500 mg (06/11/20 1848)  . cefTRIAXone (ROCEPHIN)  IV 2 g (06/11/20 1716)   PRN: guaiFENesin-dextromethorphan, levalbuterol, LORazepam Anti-infectives (From admission, onward)   Start     Dose/Rate Route Frequency Ordered Stop   06/10/20 1800  cefTRIAXone (ROCEPHIN) 2 g in sodium chloride 0.9 % 100 mL IVPB        2 g 200 mL/hr over 30 Minutes Intravenous  Every 24 hours 06/10/20 0003 06/15/20 1759   06/10/20 1800  azithromycin (ZITHROMAX) 500 mg in sodium chloride 0.9 % 250 mL IVPB        500 mg 250 mL/hr over 60 Minutes Intravenous Every 24 hours 06/10/20 0003 06/15/20 1759   06/09/20 1800  cefTRIAXone (ROCEPHIN) 1 g in sodium chloride 0.9 % 100 mL IVPB        1 g 200 mL/hr over 30 Minutes Intravenous  Once 06/09/20 1757 06/09/20 1906   06/09/20 1800  azithromycin (ZITHROMAX) 500 mg in sodium chloride 0.9 % 250 mL IVPB        500 mg 250 mL/hr over 60 Minutes Intravenous  Once 06/09/20 1757 06/09/20 1924      Assessment: 84 yo male presents to the ED and was found to be in new onset atrial fibrillation with RVR. PTA the patient is not on anticoagulation. Pharmacy is consulted to dose heparin.   Heparin level is elevated this AM (0.97), appears level was drawn from opposite arm. No overt bleeding or complications noted.  CBC stable.  Goal of Therapy:  Heparin level 0.3-0.7 units/ml Monitor platelets by anticoagulation protocol: Yes   Plan:  Heparin initially decreased to 1500 units/hr, then heparin drop stopped for conversion to Eliquis. Will start Eliquis 2.5 mg BID since Scr > 1.5 and age > 45. Will need Eliquis education prior to discharge. Will investigate if Eliquis will be affordable for him.  Reece Leader, Colon Flattery, BCCP Clinical Pharmacist  06/12/2020 10:30 AM   [facility] pharmacy phone numbers are listed on [email or web address]

## 2020-06-12 NOTE — Telephone Encounter (Signed)
Ok for patient to switch to Dr. Francine Graven. I have not seen the patient before.

## 2020-06-13 ENCOUNTER — Inpatient Hospital Stay (HOSPITAL_COMMUNITY): Payer: Medicare Other

## 2020-06-13 LAB — ANCA TITERS
Atypical P-ANCA titer: <1:20 {titer}
Atypical P-ANCA titer: <1:20 {titer}
C-ANCA: <1:20 {titer}
C-ANCA: <1:20 {titer}
P-ANCA: <1:20 {titer}
P-ANCA: <1:20 {titer}

## 2020-06-13 LAB — MPO/PR-3 (ANCA) ANTIBODIES
ANCA Proteinase 3: <3.5 U/mL (ref 0.0–3.5)
Myeloperoxidase Abs: <9 U/mL (ref 0.0–9.0)

## 2020-06-13 LAB — BASIC METABOLIC PANEL
Anion gap: 10 (ref 5–15)
BUN: 57 mg/dL — ABNORMAL HIGH (ref 8–23)
CO2: 25 mmol/L (ref 22–32)
Calcium: 8 mg/dL — ABNORMAL LOW (ref 8.9–10.3)
Chloride: 100 mmol/L (ref 98–111)
Creatinine, Ser: 2 mg/dL — ABNORMAL HIGH (ref 0.61–1.24)
GFR, Estimated: 33 mL/min — ABNORMAL LOW (ref >60)
Glucose, Bld: 122 mg/dL — ABNORMAL HIGH (ref 70–99)
Potassium: 5 mmol/L (ref 3.5–5.1)
Sodium: 135 mmol/L (ref 135–145)

## 2020-06-13 LAB — CBC
HCT: 30.2 % — ABNORMAL LOW (ref 39.0–52.0)
Hemoglobin: 10.1 g/dL — ABNORMAL LOW (ref 13.0–17.0)
MCH: 31.4 pg (ref 26.0–34.0)
MCHC: 33.4 g/dL (ref 30.0–36.0)
MCV: 93.8 fL (ref 80.0–100.0)
Platelets: 213 10*3/uL (ref 150–400)
RBC: 3.22 MIL/uL — ABNORMAL LOW (ref 4.22–5.81)
RDW: 14.1 % (ref 11.5–15.5)
WBC: 11.6 10*3/uL — ABNORMAL HIGH (ref 4.0–10.5)
nRBC: 0 % (ref 0.0–0.2)

## 2020-06-13 LAB — CYCLIC CITRUL PEPTIDE ANTIBODY, IGG/IGA: CCP Antibodies IgG/IgA: 5 units (ref 0–19)

## 2020-06-13 LAB — MAGNESIUM: Magnesium: 2.2 mg/dL (ref 1.7–2.4)

## 2020-06-13 MED ORDER — PREDNISONE 20 MG PO TABS
40.0000 mg | ORAL_TABLET | Freq: Every day | ORAL | Status: DC
  Administered 2020-06-13 – 2020-06-14 (×2): 40 mg via ORAL
  Filled 2020-06-13 (×2): qty 2

## 2020-06-13 MED ORDER — PREDNISONE 20 MG PO TABS: 20.0000 mg | ORAL_TABLET | Freq: Every day | ORAL | Status: DC

## 2020-06-13 MED ORDER — FLUTICASONE FUROATE-VILANTEROL 200-25 MCG/INH IN AEPB
1.0000 | INHALATION_SPRAY | Freq: Every day | RESPIRATORY_TRACT | Status: DC
  Administered 2020-06-14: 08:00:00 1 via RESPIRATORY_TRACT
  Filled 2020-06-13 (×2): qty 28

## 2020-06-13 MED ORDER — FUROSEMIDE 10 MG/ML IJ SOLN
40.0000 mg | Freq: Four times a day (QID) | INTRAMUSCULAR | Status: AC
  Administered 2020-06-13 (×2): 40 mg via INTRAVENOUS
  Filled 2020-06-13 (×2): qty 4

## 2020-06-13 MED ORDER — DILTIAZEM HCL ER COATED BEADS 180 MG PO CP24
180.0000 mg | ORAL_CAPSULE | Freq: Every day | ORAL | Status: DC
  Administered 2020-06-13 – 2020-06-14 (×2): 180 mg via ORAL
  Filled 2020-06-13 (×2): qty 1

## 2020-06-13 NOTE — Telephone Encounter (Signed)
06/13/20  Called and spoke with family.  Reviewed updates that patient will establish care with Dr. Francine Graven on 07/04/2020.  They have confirm the appointment time.  Nothing further needed.  Elisha Headland, FNP

## 2020-06-13 NOTE — Progress Notes (Signed)
Physical Therapy Treatment Patient Details Name: Justin Mccarty MRN: [id number] DOB: October 26, 1935 Today's Date: 06/13/2020    History of Present Illness Pt is 84 yo male with PMH including pulmonary fibrosis, GERD, hyponatremia, anemia, who was sent to ER by pulmonologist after O2 sats were 78%.  Pt has been admitted with afib with RVR, possible multifocal PNE, pulmonary fibrosis, and possible acute on chronic CHF.    PT Comments    Pt making steady progress with mobility and activity tolerance. Unable to obtain accurate reading with pulse ox probe during amb. Pt with wheezing with mobility. Amb on 4L O2.    Follow Up Recommendations  Home health PT;Supervision for mobility/OOB     Equipment Recommendations  None recommended by PT    Recommendations for Other Services       Precautions / Restrictions Precautions Precautions: Other (comment) Precaution Comments: monitor O2    Mobility  Bed Mobility               General bed mobility comments: Pt up in chair  Transfers Overall transfer level: Modified independent Equipment used: 4-wheeled walker Transfers: Sit to/from Stand Sit to Stand: Modified independent (Device/Increase time)            Ambulation/Gait Ambulation/Gait assistance: Supervision Gait Distance (Feet): 175 Feet Assistive device: 4-wheeled walker Gait Pattern/deviations: Step-through pattern;Decreased stride length Gait velocity: decr Gait velocity interpretation: 1.31 - 2.62 ft/sec, indicative of limited community ambulator General Gait Details: Steady gait with rollator.   Stairs             Wheelchair Mobility    Modified Rankin (Stroke Patients Only)       Balance Overall balance assessment: Needs assistance Sitting-balance support: No upper extremity supported Sitting balance-Leahy Scale: Normal     Standing balance support: No upper extremity supported;During functional activity Standing balance-Leahy Scale: Fair                               Cognition Arousal/Alertness: Awake/alert Behavior During Therapy: WFL for tasks assessed/performed Overall Cognitive Status: Within Functional Limits for tasks assessed                                        Exercises      General Comments General comments (skin integrity, edema, etc.): Pt on 4L O2. Unable to obtain pulse ox reading during mobility. Changed pulse ox probe after amb and obtained SpO2 of 95%.      Pertinent Vitals/Pain Pain Assessment: No/denies pain    Home Living                      Prior Function            PT Goals (current goals can now be found in the care plan section) Acute Rehab PT Goals Patient Stated Goal: return home Progress towards PT goals: Progressing toward goals    Frequency    Min 3X/week      PT Plan Current plan remains appropriate    Co-evaluation              AM-PAC PT "6 Clicks" Mobility   Outcome Measure  Help needed turning from your back to your side while in a flat bed without using bedrails?: None Help needed moving from lying on your back to sitting on the  side of a flat bed without using bedrails?: None Help needed moving to and from a bed to a chair (including a wheelchair)?: None Help needed standing up from a chair using your arms (e.g., wheelchair or bedside chair)?: None Help needed to walk in hospital room?: A Little Help needed climbing 3-5 steps with a railing? : A Little 6 Click Score: 22    End of Session Equipment Utilized During Treatment: Gait belt;Oxygen Activity Tolerance: Patient tolerated treatment well Patient left: in chair;with chair alarm set;with family/visitor present;with call bell/phone within reach Nurse Communication: Mobility status PT Visit Diagnosis: Other abnormalities of gait and mobility (R26.89)     Time: 1012-1030 PT Time Calculation (min) (ACUTE ONLY): 18 min  Charges:  $Gait Training: 8-22 mins                      [facility] PT Acute Rehabilitation Services Pager [phone] Office [phone]    Angelina Ok [facility] 06/13/2020, 10:55 AM

## 2020-06-13 NOTE — Telephone Encounter (Signed)
Okay with me 

## 2020-06-13 NOTE — Progress Notes (Addendum)
Progress Note  Patient Name: Justin Mccarty Date of Encounter: 06/13/2020  [facility] Cardiologist: Charlton Haws, MD  Subjective   Breathing improving. No chest pain.   Inpatient Medications    Scheduled Meds: . apixaban  2.5 mg Oral BID  . diltiazem  120 mg Oral Daily  . fluticasone furoate-vilanterol  1 puff Inhalation Daily  . furosemide  40 mg Intravenous Q6H  . pantoprazole  40 mg Oral Daily  . [START ON 06/14/2020] predniSONE  40 mg Oral Q breakfast   Followed by  . [START ON 06/21/2020] predniSONE  20 mg Oral Q breakfast  . QUEtiapine  25 mg Oral QHS   Continuous Infusions: . azithromycin 500 mg (06/12/20 1854)  . cefTRIAXone (ROCEPHIN)  IV 2 g (06/12/20 1745)   PRN Meds: guaiFENesin-dextromethorphan, levalbuterol, LORazepam   Vital Signs    Vitals:   06/13/20 0058 06/13/20 0100 06/13/20 0440 06/13/20 0800  BP: 116/65 123/83 101/70 (!) 114/41  Pulse: (!) 107  80 87  Resp: (!) 22 20 20  (!) 22  Temp: 97.6 F (36.4 C) 97.8 F (36.6 C) 97.6 F (36.4 C) (!) 97.5 F (36.4 C)  TempSrc: Oral  Axillary Axillary  SpO2: 95%  90% 90%  Weight:      Height:        Intake/Output Summary (Last 24 hours) at 06/13/2020 0850 Last data filed at 06/13/2020 0711 Gross per 24 hour  Intake --  Output 640 ml  Net -640 ml   Last 3 Weights 06/10/2020 06/09/2020 06/06/2020  Weight (lbs) 183 lb 6.8 oz 182 lb 15.7 oz 184 lb 12.8 oz  Weight (kg) 83.2 kg 83 kg 83.825 kg      Telemetry    Sinus rhythm at rate of 90s- Personally Reviewed  ECG    N/A  Physical Exam   GEN: Ill-appearing elderly male in no acute distress.   Neck: No JVD Cardiac: RRR, no murmurs, rubs, or gallops.  Respiratory:  Wheezing throughout GI: Soft, nontender, non-distended  MS:  Trace to 1+ edema bilaterally, No deformity. Neuro:  Nonfocal  Psych: Normal affect   Labs    High Sensitivity Troponin:   Recent Labs  Lab 06/09/20 1635 06/09/20 1832  TROPONINIHS 40* 39*       Chemistry Recent Labs  Lab 06/09/20 1633 06/10/20 0034 06/11/20 0556 06/11/20 1428 06/12/20 0753 06/13/20 0313  NA 132* 129* 133* 134* 133* 135  K 4.9 5.1 4.8 4.7 4.9 5.0  CL 99 98 98 97* 97* 100  CO2 20* 21* 25 23 24 25   GLUCOSE 143* 213* 140* 192* 130* 122*  BUN 54* 54* 57* 56* 56* 57*  CREATININE 2.11* 2.04* 2.02* 2.03* 1.98* 2.00*  CALCIUM 8.6* 8.6* 8.3* 8.4* 8.2* 8.0*  PROT 6.5 6.6 6.1*  --   --   --   ALBUMIN 2.5* 2.5* 2.4*  --   --   --   AST 663* 404* 139*  --   --   --   ALT 551* 495* 336*  --   --   --   ALKPHOS 135* 122 106  --   --   --   BILITOT 0.9 0.7 0.6  --   --   --   GFRNONAA 31* 32* 32* 32* 33* 33*  ANIONGAP 13 10 10 14 12 10      Hematology Recent Labs  Lab 06/11/20 0556 06/12/20 0628 06/13/20 0313  WBC 14.4* 14.6* 11.6*  RBC 3.33* 3.55* 3.22*  HGB 10.1*  10.8* 10.1*  HCT 31.7* 33.9* 30.2*  MCV 95.2 95.5 93.8  MCH 30.3 30.4 31.4  MCHC 31.9 31.9 33.4  RDW 13.6 13.9 14.1  PLT 285 283 213    BNP Recent Labs  Lab 06/09/20 1634  BNP 745.0*     Radiology    DG Chest 1 View  Result Date: 06/13/2020 CLINICAL DATA:  Interstitial lung disease. Shortness of breath. Wheezing. EXAM: CHEST  1 VIEW COMPARISON:  Chest x-ray 06/09/2020, 11/18/2019. Chest CT 04/04/2020. FINDINGS: Mediastinum and hilar structures normal. Stable mild cardiomegaly. No pulmonary venous congestion. Persistent right upper lobe and left base prominent infiltrates. New mild infiltrate right lung base. Underlying chronic interstitial lung disease. Tiny bilateral pleural effusions. Progressive right apical pleural thickening noted. A developing right apical pleural fluid collection cannot be excluded. No pneumothorax. Previously identified left upper lobe nodule noted on chest CT of 04/04/2020 not identified by chest x-ray. Reference is made to prior CT report 04/04/2020. Degenerative change thoracic spine. IMPRESSION: 1. Persistent right upper lobe and left base prominent  infiltrates. New mild infiltrate right lung base. Tiny bilateral pleural effusions. Progressive right apical pleural thickening. A developing right apical pleural fluid collection cannot be excluded. 2. Underlying chronic interstitial lung disease. 3. Previously identified left upper lobe nodule noted on chest CT of 04/04/2020 not identified by chest x-ray. Reference is made to prior CT report of 04/04/2020. Electronically Signed   By: Maisie Fus  Register   On: 06/13/2020 06:12   VAS [place] LOWER EXTREMITY VENOUS (DVT)  Result Date: 06/12/2020  Lower Venous DVT Study Indications: Edema.  Risk Factors: Atrial fibrillation. Comparison Study: No prior study on file Performing Technologist: Sherren Kerns RVS  Examination Guidelines: A complete evaluation includes B-mode imaging, spectral Doppler, color Doppler, and power Doppler as needed of all accessible portions of each vessel. Bilateral testing is considered an integral part of a complete examination. Limited examinations for reoccurring indications may be performed as noted. The reflux portion of the exam is performed with the patient in reverse Trendelenburg.  +---------+---------------+---------+-----------+----------+--------------+ RIGHT    CompressibilityPhasicitySpontaneityPropertiesThrombus Aging +---------+---------------+---------+-----------+----------+--------------+ CFV      Full                                         pulsatile      +---------+---------------+---------+-----------+----------+--------------+ SFJ      Full                                                        +---------+---------------+---------+-----------+----------+--------------+ FV Prox  Full                                                        +---------+---------------+---------+-----------+----------+--------------+ FV Mid   Full                                                         +---------+---------------+---------+-----------+----------+--------------+ FV DistalFull                                                        +---------+---------------+---------+-----------+----------+--------------+  PFV      Full                                                        +---------+---------------+---------+-----------+----------+--------------+ POP      Full                                         pulsatile      +---------+---------------+---------+-----------+----------+--------------+ PTV      Full                                                        +---------+---------------+---------+-----------+----------+--------------+ PERO     Full                                                        +---------+---------------+---------+-----------+----------+--------------+   +---------+---------------+---------+-----------+----------+--------------+ LEFT     CompressibilityPhasicitySpontaneityPropertiesThrombus Aging +---------+---------------+---------+-----------+----------+--------------+ CFV      Full                                         pulsatile      +---------+---------------+---------+-----------+----------+--------------+ SFJ      Full                                                        +---------+---------------+---------+-----------+----------+--------------+ FV Prox  Full                                                        +---------+---------------+---------+-----------+----------+--------------+ FV Mid   Full                                                        +---------+---------------+---------+-----------+----------+--------------+ FV DistalFull                                                        +---------+---------------+---------+-----------+----------+--------------+ PFV      Full                                                         +---------+---------------+---------+-----------+----------+--------------+  POP      Full                                         pulsatile      +---------+---------------+---------+-----------+----------+--------------+ PTV      Full                                                        +---------+---------------+---------+-----------+----------+--------------+ PERO     Full                                                        +---------+---------------+---------+-----------+----------+--------------+     Summary: RIGHT: - There is no evidence of deep vein thrombosis in the lower extremity.  LEFT: - There is no evidence of deep vein thrombosis in the lower extremity.  *See table(s) above for measurements and observations. Electronically signed by Fabienne Bruns MD on 06/12/2020 at 7:40:59 PM.    Final     Cardiac Studies   Echo 06/10/2020 1. Left ventricular ejection fraction, by estimation, is 45 to 50%. The  left ventricle has mildly decreased function. The left ventricle  demonstrates global hypokinesis. There is moderate concentric left  ventricular hypertrophy. Left ventricular  diastolic function could not be evaluated. There is the interventricular  septum is flattened in systole and diastole, consistent with right  ventricular pressure and volume overload.  2. Right ventricular systolic function is moderately reduced. The right  ventricular size is moderately enlarged. There is moderately elevated  pulmonary artery systolic pressure. The estimated right ventricular  systolic pressure is 47.7 mmHg.  3. Left atrial size was mildly dilated.  4. Right atrial size was moderately dilated.  5. The mitral valve is normal in structure. No evidence of mitral valve  regurgitation. No evidence of mitral stenosis.  6. The aortic valve is normal in structure. Aortic valve regurgitation is  mild. Mild to moderate aortic valve sclerosis/calcification is present,  without  any evidence of aortic stenosis.  7. The inferior vena cava is dilated in size with <50% respiratory  variability, suggesting right atrial pressure of 15 mmHg.   Patient Profile     84 y.o. male with a hx of nonspecific ILD follow by pulmonary and GERD who is being seen for the evaluation of atrial fibrillation with RVR and acute CHF in setting of acute hypoxic respiratory failure.   Assessment & Plan    1. PAF - Felt 2nd to underlying lung dz - Heart rate improved on IV Cardizem and transitioned to Po Cardizem 120mg  yesterday >> heart rate in in 90s>>>Increase Cardizem to 180 mg daily to prevenet  tachycardia with ambulation - Treated with IV heparin for anticoagulation initially and transitioned to Eliquis 2.5mg  BID as no planned procedure -No BB given lung disease  2.  Acute on chronic combined CHF -Echocardiogram this admission showed LV function of 45 to 50% and  moderate LVH. There is the interventricular  septum is flattened in systole and diastole, consistent with right  ventricular pressure and volume overload. Reduced RV function.  -On IV  Lasix 40 mg twice daily for diuresis>>> net I&O -3.2 L (unable to record output completely). No daily weight>> still has lower extremity edema on exam.  I will continue IV Lasix for morning dose and will review p.m. dose with MD.  Try compression stocking and leg elevation. -Cardiomyopathy likely due to intermittent tachycardia from A. Fib -No beta-blocker given lung disease -No ACE/ARB due to CKD - BP intermittently soft to add Imdur/Hydralazine>> will review with MD and LVEF not severely depressed   3.  AKI with underlying chronic kidney disease with possible stage III -Unknown baseline - Renal function stable around 2  4.  Acute hypoxic respiratory failure, possible multifocal pneumonia -Per primary/pulmonary team  For questions or updates, please contact [facility] Please consult [email or web address] for contact info under         SignedManson Passey, PA  06/13/2020, 8:50 AM    Patient seen and examined.  Agree with above documentation.  On exam, patient is alert, normal rate, irregular rhythm, no murmurs, diffuse expiratory wheezing, 1+ LE edema.  Telemetry shows Afib with rate 80-90s.   Increase diltiazem to 180 mg daily.  Continue IV lasix for today, likely switch to PO tomorrow.  Little Ishikawa, MD

## 2020-06-13 NOTE — Evaluation (Signed)
Occupational Therapy Evaluation Patient Details Name: Justin Mccarty MRN: [id number] DOB: 1935/08/06 Today's Date: 06/13/2020    History of Present Illness Pt is 84 yo male with PMH including pulmonary fibrosis, GERD, hyponatremia, anemia, who was sent to ER by pulmonologist after O2 sats were 78%.  Pt has been admitted with afib with RVR, possible multifocal PNE, pulmonary fibrosis, and possible acute on chronic CHF.   Clinical Impression   Pt presents with decline in function and safety with ADLs and ADL mobility with impaired balance and endurance. Pt min guard A - Sup level with LB ADLs and ADL mobility. Pt with have 24/7 assist at home prn. Pt would benefit from acute OT services to address impairments to maximize level of function and safety    Follow Up Recommendations  No OT follow up;Supervision - Intermittent    Equipment Recommendations  None recommended by OT    Recommendations for Other Services       Precautions / Restrictions Precautions Precautions: Other (comment) Precaution Comments: monitor O2 Restrictions Weight Bearing Restrictions: No      Mobility Bed Mobility Overal bed mobility: Needs Assistance Bed Mobility: Supine to Sit     Supine to sit: Supervision     General bed mobility comments: Pt up in chair    Transfers Overall transfer level: Modified independent Equipment used: Rolling walker (2 wheeled) Transfers: Sit to/from Stand Sit to Stand: Min guard;Supervision         General transfer comment: Min guard progressed to supervision, cues for correct hand placement    Balance Overall balance assessment: Needs assistance Sitting-balance support: No upper extremity supported Sitting balance-Leahy Scale: Normal     Standing balance support: No upper extremity supported;During functional activity Standing balance-Leahy Scale: Fair                             ADL either performed or assessed with clinical judgement   ADL  Overall ADL's : Needs assistance/impaired Eating/Feeding: Independent   Grooming: Wash/dry hands;Wash/dry face;Standing;Min guard   Upper Body Bathing: Set up;Sitting   Lower Body Bathing: Min guard;Sit to/from stand   Upper Body Dressing : Set up;Sitting   Lower Body Dressing: Min guard;Sit to/from stand   Toilet Transfer: Min guard;Supervision/safety;Ambulation;RW;Cueing for safety   Toileting- Clothing Manipulation and Hygiene: Min guard       Functional mobility during ADLs: Min guard;Supervision/safety;Cueing for safety       Vision Baseline Vision/History: Wears glasses Patient Visual Report: No change from baseline       Perception     Praxis      Pertinent Vitals/Pain Pain Assessment: No/denies pain     Hand Dominance Right   Extremity/Trunk Assessment Upper Extremity Assessment Upper Extremity Assessment: Overall WFL for tasks assessed       Cervical / Trunk Assessment Cervical / Trunk Assessment: Normal   Communication Communication Communication: HOH   Cognition Arousal/Alertness: Awake/alert Behavior During Therapy: WFL for tasks assessed/performed Overall Cognitive Status: Within Functional Limits for tasks assessed                                     General Comments  Pt on 4L O2. Unable to obtain pulse ox reading during mobility. Changed pulse ox probe after amb and obtained SpO2 of 95%.    Exercises     Shoulder Instructions  Home Living Family/patient expects to be discharged to:: Private residence Living Arrangements: Spouse/significant other Available Help at Discharge: Family;Available 24 hours/day Type of Home: House Home Access: Stairs to enter [employer] of Steps: 2 Entrance Stairs-Rails: None Home Layout: Able to live on main level with bedroom/bathroom;Two level     Bathroom Shower/Tub: Tub/shower unit;Walk-in shower   Bathroom Toilet: Standard     Home Equipment: Environmental consultant - 4  wheels;Cane - single point;Grab bars - tub/shower          Prior Functioning/Environment Level of Independence: Needs assistance  Gait / Transfers Assistance Needed: Can ambulate in community without AD ADL's / Homemaking Assistance Needed: Independent with ADLs and IADLs   Comments: Reports decline over past 2 weeks due to Shortness of breath        OT Problem List: Decreased activity tolerance;Decreased knowledge of use of DME or AE;Impaired balance (sitting and/or standing)      OT Treatment/Interventions: Self-care/ADL training;Patient/family education;DME and/or AE instruction    OT Goals(Current goals can be found in the care plan section) Acute Rehab OT Goals Patient Stated Goal: return home OT Goal Formulation: With patient/family Time For Goal Achievement: 06/27/20 Potential to Achieve Goals: Good ADL Goals Pt Will Perform Grooming: with supervision;with modified independence;standing;with caregiver independent in assisting Pt Will Perform Lower Body Bathing: with supervision;with modified independence;sit to/from stand;with caregiver independent in assisting Pt Will Perform Lower Body Dressing: with supervision;with modified independence;sit to/from stand;with caregiver independent in assisting Pt Will Transfer to Toilet: with modified independence;ambulating Pt Will Perform Toileting - Clothing Manipulation and hygiene: with supervision;with modified independence;sit to/from stand;with caregiver independent in assisting  OT Frequency: Min 2X/week   Barriers to D/C:    no barriers       Co-evaluation              AM-PAC OT "6 Clicks" Daily Activity     Outcome Measure Help from another person eating meals?: None Help from another person taking care of personal grooming?: A Little Help from another person toileting, which includes using toliet, bedpan, or urinal?: A Little Help from another person bathing (including washing, rinsing, drying)?: A Little Help  from another person to put on and taking off regular upper body clothing?: None Help from another person to put on and taking off regular lower body clothing?: A Little 6 Click Score: 20   End of Session Equipment Utilized During Treatment: Gait belt;Rolling walker;Oxygen  Activity Tolerance: Patient tolerated treatment well Patient left: in chair;with call bell/phone within reach;with nursing/sitter in room  OT Visit Diagnosis: Other abnormalities of gait and mobility (R26.89);Muscle weakness (generalized) (M62.81)                Time: 4097-3532 OT Time Calculation (min): 21 min Charges:  OT General Charges $OT Visit: 1 Visit OT Evaluation $OT Eval Moderate Complexity: 1 Mod OT Treatments $Self Care/Home Management : 8-22 mins    Galen Manila 06/13/2020, 1:02 PM

## 2020-06-13 NOTE — Progress Notes (Signed)
Physical Therapy Treatment Patient Details Name: Justin Mccarty MRN: [id number] DOB: 07-04-1935 Today's Date: 06/13/2020    History of Present Illness Pt is 84 yo male with PMH including pulmonary fibrosis, GERD, hyponatremia, anemia, who was sent to ER by pulmonologist after O2 sats were 78%.  Pt has been admitted with afib with RVR, possible multifocal PNE, pulmonary fibrosis, and possible acute on chronic CHF.    PT Comments    Pt seen for further mobility today to incr activity tolerance. Pt making steady progress.    Follow Up Recommendations  Home health PT;Supervision for mobility/OOB     Equipment Recommendations  None recommended by PT    Recommendations for Other Services       Precautions / Restrictions Precautions Precautions: Other (comment) Precaution Comments: monitor O2 Restrictions Weight Bearing Restrictions: No    Mobility  Bed Mobility Overal bed mobility: Needs Assistance Bed Mobility: Supine to Sit     Supine to sit: Supervision     General bed mobility comments: Pt up in chair  Transfers Overall transfer level: Modified independent Equipment used: 4-wheeled walker Transfers: Sit to/from Stand Sit to Stand: Modified independent (Device/Increase time)         General transfer comment: Min guard progressed to supervision, cues for correct hand placement  Ambulation/Gait Ambulation/Gait assistance: Supervision Gait Distance (Feet): 140 Feet Assistive device: 4-wheeled walker Gait Pattern/deviations: Step-through pattern;Decreased stride length Gait velocity: decr Gait velocity interpretation: 1.31 - 2.62 ft/sec, indicative of limited community ambulator General Gait Details: Steady gait with rollator.   Stairs             Wheelchair Mobility    Modified Rankin (Stroke Patients Only)       Balance Overall balance assessment: Needs assistance Sitting-balance support: No upper extremity supported Sitting balance-Leahy  Scale: Normal     Standing balance support: No upper extremity supported;During functional activity Standing balance-Leahy Scale: Fair                              Cognition Arousal/Alertness: Awake/alert Behavior During Therapy: WFL for tasks assessed/performed Overall Cognitive Status: Within Functional Limits for tasks assessed                                        Exercises      General Comments General comments (skin integrity, edema, etc.): Removed O2 at rest with SpO2 95%. Amb on RA initially with SpO2 to 82%. Placed O2 at 4L and SpO2 returned to 92%.      Pertinent Vitals/Pain Pain Assessment: No/denies pain    Home Living Family/patient expects to be discharged to:: Private residence Living Arrangements: Spouse/significant other Available Help at Discharge: Family;Available 24 hours/day Type of Home: House Home Access: Stairs to enter Entrance Stairs-Rails: None Home Layout: Able to live on main level with bedroom/bathroom;Two level Home Equipment: Walker - 4 wheels;Cane - single point;Grab bars - tub/shower      Prior Function Level of Independence: Needs assistance  Gait / Transfers Assistance Needed: Can ambulate in community without AD ADL's / Homemaking Assistance Needed: Independent with ADLs and IADLs Comments: Reports decline over past 2 weeks due to Shortness of breath   PT Goals (current goals can now be found in the care plan section) Acute Rehab PT Goals Patient Stated Goal: return home Progress towards PT goals: Progressing toward  goals    Frequency    Min 3X/week      PT Plan Current plan remains appropriate    Co-evaluation              AM-PAC PT "6 Clicks" Mobility   Outcome Measure  Help needed turning from your back to your side while in a flat bed without using bedrails?: None Help needed moving from lying on your back to sitting on the side of a flat bed without using bedrails?: None Help  needed moving to and from a bed to a chair (including a wheelchair)?: None Help needed standing up from a chair using your arms (e.g., wheelchair or bedside chair)?: None Help needed to walk in hospital room?: A Little Help needed climbing 3-5 steps with a railing? : A Little 6 Click Score: 22    End of Session Equipment Utilized During Treatment: Gait belt;Oxygen Activity Tolerance: Patient tolerated treatment well Patient left: in chair;with chair alarm set;with call bell/phone within reach Nurse Communication: Mobility status PT Visit Diagnosis: Other abnormalities of gait and mobility (R26.89)     Time: 3212-2482 PT Time Calculation (min) (ACUTE ONLY): 19 min  Charges:  $Gait Training: 8-22 mins                     [facility] PT Acute Rehabilitation Services Pager [phone] Office [phone]    Angelina Ok [facility] 06/13/2020, 3:04 PM

## 2020-06-13 NOTE — Care Management Important Message (Signed)
Important Message  Patient Details  Name: Justin Mccarty MRN: [id number] Date of Birth: 1936/03/21   Medicare Important Message Given:  Yes     Fortune Brannigan P Jozie Wulf 06/13/2020, 3:18 PM

## 2020-06-13 NOTE — Progress Notes (Signed)
SATURATION QUALIFICATIONS: (This note is used to comply with regulatory documentation for home oxygen)  Patient Saturations on Room Air at Rest = 95%  Patient Saturations on Room Air while Ambulating = 82%  Patient Saturations on 4 Liters of oxygen while Ambulating = 92%  Please briefly explain why patient needs home oxygen: Pt unable to maintain adequate oxygenation with activity without supplemental O2.   [name] [facility] PT Acute Rehabilitation Services Pager [phone] Office [phone]

## 2020-06-13 NOTE — Progress Notes (Signed)
PROGRESS NOTE    Justin Mccarty  ZSW:[id number] DOB: 1936/06/29 DOA: 06/09/2020 PCP: Ephriam Jenkins, PA   Chief Complaint  Patient presents with   Shortness of Breath   Brief Narrative: 84 year old male with history of pulmonary fibrosis, GERD, hyponatremia, anemia sent to ED by his pulmonology for hypoxia with 78% on room air.   As per the report He has had progressive worsening of his symptoms. In the last 2 weeks he has gotten worse with cough increase pain and now he has oxygen requirement which is new. During that visit also he was noted to have irregular rhythm. He was placed on 6 L of oxygen and sent over to the ER. He is now about 90% on 2 L. His initial oxygen sat in the ER was 89%. Patient found to be in A. fib with RVR. No prior documented history of A. fib. He is therefore being admitted with new onset A. fib with RVR and worsening hypoxemia. His chest x-ray indicated right lung pneumonia but has no history of dysphagia and denied any choking. No evidence that he may have had aspiration..  ED Course: HYPOXIC sats 88% on room air > 96% 3 L. Sodium 132 potassium 4.9 chloride 99 CO2 20 glucose 143 BUN 53 creatinine 2.11 calcium 8.6 gap of 13 magnesium 2.3. Alkaline phosphatase 135 albumin 2.5 AST 663 ALT 551 total protein 6.5. BNP is 745. Troponin is 40 and then 39. White count 10.1 hemoglobin 10.7 platelets 301,PT 13.9 INR 1.8 TSH 2.645. Acute respiratory panel is negative. Chest x-ray shows interval development of multifocal infection. Likely trace left pleural effusion also. Findings in the right upper lobe right middle lobe lingula and left lower lobe. He will be admitted for treatment of right more than left pneumonia with A. fib with RVR Pulmonary and cardiology was consulted.  Patient was admitted. Patient's is being diuresed, also on steroid with aggressive bronchodilator and IV antibiotics and slowly improving.  Subjective:  Patient is up on the chair  resting comfortably. He is on 4 L nasal cannula Denies any shortness of breath.  Assessment & Plan:  Paroxysmal A-fib with RVR, he had a prior history of A. Fib apparently: [place] cardiology input.  Now he is on Eliquis and Cardizem p.o. continue to monitor on telemetry.   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  Echo shows chronic RV failure with group 2/3 pulmonary hypertension, lower extremity duplex no DVT.  With muscular deconditioning having respiratory issues.  Appreciate pulmonary input, continue on diuresis as tolerated, continue IV antibiotics, 2-week steroid taper and monitor renal function closely.  Continue to work with PT/OT.  Wean supplemental oxygen as tolerated, may need to go home with oxygen.  Possible multifocal pneumonia patient met sepsis criteria on admission w/ tachycardia hr > 90, RR > 24 and, tachypnea and hypoxia: On ceftriaxone/erythromycin.RVP panel negative,had leukocytosis at 14k but also on steroid.  Procalcitonin  0.6> 0.4 currently is afebrile, white count improved to 11.6k.  Delirium related to steroids unfamiliar setting and infection.  Mental status seems to be improving.  Continue nightly Seroquel, continue reorientation, delirium precaution.  Encourage PT OT and ambulation.  AKI versus acute on CKD versus CKD: unknown  ckd stage:creatinine 06/09/2020 on admission 2.11, previously in 2010 was 1.1.  Question cardiorenal syndrome, creatinine holding stable at 1.9 to 2 range. Monitor closely with diuresis.  Recent Labs  Lab 06/10/20 0034 06/11/20 0556 06/11/20 1428 06/12/20 0753 06/13/20 0313  BUN 54* 57* 56* 56* 57*  CREATININE 2.04* 2.02* 2.03* 1.98* 2.00*   Acute on chronic combined systolic and diastolic CHF, EF 26/71 echo preserved felt to be volume  overloaded with flattened interventricular septum therefore on IV Lasix.  Had significant ankle edema and overall improving with diuresis. weight at 183 pound up from 182 monitor intake output, daily weight and monitor renal function closely  Transaminitis improving.  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  Acute Hepatitis panel negative.  Repeat LFT for morning  Hyponatremia monitor sodium, likely secondary to SIADH.  Sodium has normalized.   Nutrition: Diet Order            Diet Heart Room service appropriate? No; Fluid consistency: Thin  Diet effective now                 Body mass index is 28.73 kg/m.  DVT prophylaxis: Place TED hose Start: 06/13/20 0852 apixaban (ELIQUIS) tablet 2.5 mg Start: 06/12/20 1115 Code Status:   Code Status: Full Code  Family Communication: plan of care discussed with patient at bedside.  Status is: Inpatient Remains inpatient appropriate because:IV treatments appropriate due to intensity of illness or inability to take PO and Inpatient level of care appropriate due to severity of illness  Dispo: The patient is from: Home              Anticipated d/c is to: Home w HH- follow PTOT dispo              Anticipated d/c date is: 2 days              Patient currently is not medically stable to d/c.  Consultants:see note  Procedures:see note  Culture/Microbiology    Component Value Date/Time   SDES BLOOD LEFT HAND 06/10/2020 0054   SPECREQUEST  06/10/2020 0054    BOTTLES DRAWN AEROBIC AND ANAEROBIC Blood Culture results may not be optimal due to an inadequate volume of blood received in culture bottles   CULT  06/10/2020 0054    NO GROWTH 2 DAYS Performed at [facility], [place] [address], [place], [place] [address]    REPTSTATUS PENDING 06/10/2020 0054    Other culture-see note  Medications: Scheduled Meds:  apixaban  2.5 mg Oral BID   diltiazem  180 mg Oral Daily   fluticasone furoate-vilanterol  1 puff Inhalation Daily    furosemide  40 mg Intravenous Q6H   pantoprazole  40 mg Oral Daily   [START ON 06/14/2020] predniSONE  40 mg Oral Q breakfast   Followed by   Derrill Memo ON 06/21/2020] predniSONE  20 mg Oral Q breakfast   QUEtiapine  25 mg Oral QHS   Continuous Infusions:  azithromycin 500 mg (06/12/20 1854)   cefTRIAXone (ROCEPHIN)  IV 2 g (06/12/20 1745)    Antimicrobials: Anti-infectives (From admission, onward)   Start     Dose/Rate Route Frequency Ordered Stop   06/10/20 1800  cefTRIAXone (ROCEPHIN) 2 g in sodium chloride 0.9 % 100 mL IVPB        2 g 200 mL/hr over 30 Minutes Intravenous Every 24 hours 06/10/20 0003 06/15/20 1759   06/10/20 1800  azithromycin (ZITHROMAX) 500 mg in sodium chloride 0.9 % 250 mL IVPB        500 mg 250 mL/hr over 60 Minutes Intravenous Every 24 hours 06/10/20 0003 06/15/20 1759   06/09/20  1800  cefTRIAXone (ROCEPHIN) 1 g in sodium chloride 0.9 % 100 mL IVPB        1 g 200 mL/hr over 30 Minutes Intravenous  Once 06/09/20 1757 06/09/20 1906   06/09/20 1800  azithromycin (ZITHROMAX) 500 mg in sodium chloride 0.9 % 250 mL IVPB        500 mg 250 mL/hr over 60 Minutes Intravenous  Once 06/09/20 1757 06/09/20 1924     Objective: Vitals: Today's Vitals   06/13/20 0100 06/13/20 0440 06/13/20 0800 06/13/20 0900  BP: 123/83 101/70 (!) 114/41   Pulse:  80 87   Resp: 20 20 (!) 22   Temp: 97.8 F (36.6 C) 97.6 F (36.4 C) (!) 97.5 F (36.4 C)   TempSrc:  Axillary Axillary   SpO2:  90% 90%   Weight:      Height:      PainSc:    0-No pain    Intake/Output Summary (Last 24 hours) at 06/13/2020 1130 Last data filed at 06/13/2020 0711 Gross per 24 hour  Intake --  Output 400 ml  Net -400 ml   Filed Weights   06/09/20 1554 06/10/20 0007  Weight: 83 kg 83.2 kg   Weight change:   Intake/Output from previous day: 12/13 0701 - 12/14 0700 In: -  Out: 450 [Urine:450] Intake/Output this shift: Total I/O In: -  Out: 400  [Urine:400]  Examination: General exam: AAOx3, NAD, weak appearing. HEENT:Oral mucosa moist, Ear/Nose WNL grossly, dentition normal. Respiratory system: bilaterally clear with basal crackles,no use of accessory muscle Cardiovascular system: S1 & S2 +, No JVD. Gastrointestinal system: Abdomen soft, NT,ND, BS+ Nervous System:Alert, awake, moving extremities and grossly non-focal. Extremities: No edema, distal peripheral pulses palpable.  Skin: No rashes,no icterus. MSK: Normal muscle bulk,tone, power.  Data Reviewed: I have personally reviewed following labs and imaging studies CBC: Recent Labs  Lab 06/09/20 1633 06/10/20 0034 06/11/20 0556 06/12/20 0628 06/13/20 0313  WBC 10.1 9.9 14.4* 14.6* 11.6*  NEUTROABS 8.4* 8.9*  --   --   --   HGB 10.7* 10.6* 10.1* 10.8* 10.1*  HCT 32.7* 31.4* 31.7* 33.9* 30.2*  MCV 94.8 92.9 95.2 95.5 93.8  PLT 301 290 285 283 939   Basic Metabolic Panel: Recent Labs  Lab 06/09/20 1633 06/10/20 0034 06/11/20 0556 06/11/20 1428 06/12/20 0753 06/13/20 0313  NA 132* 129* 133* 134* 133* 135  K 4.9 5.1 4.8 4.7 4.9 5.0  CL 99 98 98 97* 97* 100  CO2 20* 21* _0 GLUCOSE 143* 213* 140* 192* 130* 122*  BUN 54* 54* 57* 56* 56* 57*  CREATININE 2.11* 2.04* 2.02* 2.03* 1.98* 2.00*  CALCIUM 8.6* 8.6* 8.3* 8.4* 8.2* 8.0*  MG 2.3  --  2.2  --   --  2.2   GFR: Estimated Creatinine Clearance: 29.4 mL/min (A) (by C-G formula based on SCr of 2 mg/dL (H)). Liver Function Tests: Recent Labs  Lab 06/09/20 1633 06/10/20 0034 06/11/20 0556  AST 663* 404* 139*  ALT 551* 495* 336*  ALKPHOS 135* 122 106  BILITOT 0.9 0.7 0.6  PROT 6.5 6.6 6.1*  ALBUMIN 2.5* 2.5* 2.4*   No results for input(s): LIPASE, AMYLASE in the last 168 hours. No results for input(s): AMMONIA in the last 168 hours. Coagulation Profile: Recent Labs  Lab 06/09/20 1633  INR 1.8*   Cardiac Enzymes: Recent Labs  Lab 06/10/20 1503  CKTOTAL 42*   BNP (last 3 results) No  results for input(s): PROBNP  in the last 8760 hours. HbA1C: No results for input(s): HGBA1C in the last 72 hours. CBG: No results for input(s): GLUCAP in the last 168 hours. Lipid Profile: No results for input(s): CHOL, HDL, LDLCALC, TRIG, CHOLHDL, LDLDIRECT in the last 72 hours. Thyroid Function Tests: Recent Labs    06/10/20 1652  TSH 1.449   Anemia Panel: No results for input(s): VITAMINB12, FOLATE, FERRITIN, TIBC, IRON, RETICCTPCT in the last 72 hours. Sepsis Labs: Recent Labs  Lab 06/10/20 1400 06/11/20 1058  PROCALCITON 0.69 0.47    Recent Results (from the past 240 hour(s))  Resp Panel by RT-PCR (Flu A&B, Covid) Nasopharyngeal Swab     Status: None   Collection Time: 06/09/20  9:25 PM   Specimen: Nasopharyngeal Swab; Nasopharyngeal(NP) swabs in vial transport medium  Result Value Ref Range Status   SARS Coronavirus 2 by RT PCR NEGATIVE NEGATIVE Final    Comment: (NOTE) SARS-CoV-2 target nucleic acids are NOT DETECTED.  The SARS-CoV-2 RNA is generally detectable in upper respiratory specimens during the acute phase of infection. The lowest concentration of SARS-CoV-2 viral copies this assay can detect is 138 copies/mL. A negative result does not preclude SARS-Cov-2 infection and should not be used as the sole basis for treatment or other patient management decisions. A negative result may occur with  improper specimen collection/handling, submission of specimen other than nasopharyngeal swab, presence of viral mutation(s) within the areas targeted by this assay, and inadequate number of viral copies(<138 copies/mL). A negative result must be combined with clinical observations, patient history, and epidemiological information. The expected result is Negative.  Fact Sheet for Patients:  EntrepreneurPulse.com.au  Fact Sheet for Healthcare Providers:  IncredibleEmployment.be  This test is no t yet approved or cleared by the  [place] FDA and  has been authorized for detection and/or diagnosis of SARS-CoV-2 by FDA under an Emergency Use Authorization (EUA). This EUA will remain  in effect (meaning this test can be used) for the duration of the COVID-19 declaration under Section 564(b)(1) of the Act, 21 U.S.C.section 360bbb-3(b)(1), unless the authorization is terminated  or revoked sooner.       Influenza A by PCR NEGATIVE NEGATIVE Final   Influenza B by PCR NEGATIVE NEGATIVE Final    Comment: (NOTE) The Xpert Xpress SARS-CoV-2/FLU/RSV plus assay is intended as an aid in the diagnosis of influenza from Nasopharyngeal swab specimens and should not be used as a sole basis for treatment. Nasal washings and aspirates are unacceptable for Xpert Xpress SARS-CoV-2/FLU/RSV testing.  Fact Sheet for Patients: EntrepreneurPulse.com.au  Fact Sheet for Healthcare Providers: IncredibleEmployment.be  This test is not yet approved or cleared by the [place] FDA and has been authorized for detection and/or diagnosis of SARS-CoV-2 by FDA under an Emergency Use Authorization (EUA). This EUA will remain in effect (meaning this test can be used) for the duration of the COVID-19 declaration under Section 564(b)(1) of the Act, 21 U.S.C. section 360bbb-3(b)(1), unless the authorization is terminated or revoked.  Performed at [facility], [place] [address], [place], [place] [address]   Culture, blood (routine x 2) Call MD if unable to obtain prior to antibiotics being given     Status: None (Preliminary result)   Collection Time: 06/10/20 12:34 AM   Specimen: BLOOD RIGHT HAND  Result Value Ref Range Status   Specimen Description BLOOD RIGHT HAND  Final   Special Requests AEROBIC BOTTLE ONLY Blood Culture adequate volume  Final   Culture   Final  NO GROWTH 2 DAYS Performed at [facility], [place] [address], [place], [place] [address]    Report Status PENDING   Incomplete  Culture, blood (routine x 2) Call MD if unable to obtain prior to antibiotics being given     Status: None (Preliminary result)   Collection Time: 06/10/20 12:54 AM   Specimen: BLOOD LEFT HAND  Result Value Ref Range Status   Specimen Description BLOOD LEFT HAND  Final   Special Requests   Final    BOTTLES DRAWN AEROBIC AND ANAEROBIC Blood Culture results may not be optimal due to an inadequate volume of blood received in culture bottles   Culture   Final    NO GROWTH 2 DAYS Performed at [facility], [place] [address] [place], [place] [address]    Report Status PENDING  Incomplete  Respiratory Panel by PCR     Status: None   Collection Time: 06/10/20  1:41 PM   Specimen: Nasopharyngeal Swab; Respiratory  Result Value Ref Range Status   Adenovirus NOT DETECTED NOT DETECTED Final   Coronavirus 229E NOT DETECTED NOT DETECTED Final    Comment: (NOTE) The Coronavirus on the Respiratory Panel, DOES NOT test for the novel  Coronavirus (2019 nCoV)    Coronavirus HKU1 NOT DETECTED NOT DETECTED Final   Coronavirus NL63 NOT DETECTED NOT DETECTED Final   Coronavirus OC43 NOT DETECTED NOT DETECTED Final   Metapneumovirus NOT DETECTED NOT DETECTED Final   Rhinovirus / Enterovirus NOT DETECTED NOT DETECTED Final   Influenza A NOT DETECTED NOT DETECTED Final   Influenza B NOT DETECTED NOT DETECTED Final   Parainfluenza Virus 1 NOT DETECTED NOT DETECTED Final   Parainfluenza Virus 2 NOT DETECTED NOT DETECTED Final   Parainfluenza Virus 3 NOT DETECTED NOT DETECTED Final   Parainfluenza Virus 4 NOT DETECTED NOT DETECTED Final   Respiratory Syncytial Virus NOT DETECTED NOT DETECTED Final   Bordetella pertussis NOT DETECTED NOT DETECTED Final   Bordetella Parapertussis NOT DETECTED NOT DETECTED Final   Chlamydophila pneumoniae NOT DETECTED NOT DETECTED Final   Mycoplasma pneumoniae NOT DETECTED NOT DETECTED Final    Comment: Performed at [facility], [address]  [place], [place] [address]  MRSA PCR Screening     Status: None   Collection Time: 06/11/20  9:32 AM   Specimen: Nasal Mucosa; Nasopharyngeal  Result Value Ref Range Status   MRSA by PCR NEGATIVE NEGATIVE Final    Comment:        The GeneXpert MRSA Assay (FDA approved for NASAL specimens only), is one component of a comprehensive MRSA colonization surveillance program. It is not intended to diagnose MRSA infection nor to guide or monitor treatment for MRSA infections. Performed at [facility], [place] [address], [place],  [address]      Radiology Studies: DG Chest 1 View  Result Date: 06/13/2020 CLINICAL DATA:  Interstitial lung disease. Shortness of breath. Wheezing. EXAM: CHEST  1 VIEW COMPARISON:  Chest x-ray 06/09/2020, 11/18/2019. Chest CT 04/04/2020. FINDINGS: Mediastinum and hilar structures normal. Stable mild cardiomegaly. No pulmonary venous congestion. Persistent right upper lobe and left base prominent infiltrates. New mild infiltrate right lung base. Underlying chronic interstitial lung disease. Tiny bilateral pleural effusions. Progressive right apical pleural thickening noted. A developing right apical pleural fluid collection cannot be excluded. No pneumothorax. Previously identified left upper lobe nodule noted on chest CT of 04/04/2020 not identified by chest x-ray. Reference is made to prior CT report  04/04/2020. Degenerative change thoracic spine. IMPRESSION: 1. Persistent right upper lobe and left base prominent infiltrates. New mild infiltrate right lung base. Tiny bilateral pleural effusions. Progressive right apical pleural thickening. A developing right apical pleural fluid collection cannot be excluded. 2. Underlying chronic interstitial lung disease. 3. Previously identified left upper lobe nodule noted on chest CT of 04/04/2020 not identified by chest x-ray. Reference is made to prior CT report of 04/04/2020. Electronically Signed   By: Marcello Moores  Register   On:  06/13/2020 06:12   VAS [place] LOWER EXTREMITY VENOUS (DVT)  Result Date: 06/12/2020  Lower Venous DVT Study Indications: Edema.  Risk Factors: Atrial fibrillation. Comparison Study: No prior study on file Performing Technologist: Sharion Dove RVS  Examination Guidelines: A complete evaluation includes B-mode imaging, spectral Doppler, color Doppler, and power Doppler as needed of all accessible portions of each vessel. Bilateral testing is considered an integral part of a complete examination. Limited examinations for reoccurring indications may be performed as noted. The reflux portion of the exam is performed with the patient in reverse Trendelenburg.  +---------+---------------+---------+-----------+----------+--------------+  RIGHT     Compressibility Phasicity Spontaneity Properties Thrombus Aging  +---------+---------------+---------+-----------+----------+--------------+  CFV       Full                                             pulsatile       +---------+---------------+---------+-----------+----------+--------------+  SFJ       Full                                                             +---------+---------------+---------+-----------+----------+--------------+  FV Prox   Full                                                             +---------+---------------+---------+-----------+----------+--------------+  FV Mid    Full                                                             +---------+---------------+---------+-----------+----------+--------------+  FV Distal Full                                                             +---------+---------------+---------+-----------+----------+--------------+  PFV       Full                                                             +---------+---------------+---------+-----------+----------+--------------+  POP       Full                                             pulsatile        +---------+---------------+---------+-----------+----------+--------------+  PTV       Full                                                             +---------+---------------+---------+-----------+----------+--------------+  PERO      Full                                                             +---------+---------------+---------+-----------+----------+--------------+   +---------+---------------+---------+-----------+----------+--------------+  LEFT      Compressibility Phasicity Spontaneity Properties Thrombus Aging  +---------+---------------+---------+-----------+----------+--------------+  CFV       Full                                             pulsatile       +---------+---------------+---------+-----------+----------+--------------+  SFJ       Full                                                             +---------+---------------+---------+-----------+----------+--------------+  FV Prox   Full                                                             +---------+---------------+---------+-----------+----------+--------------+  FV Mid    Full                                                             +---------+---------------+---------+-----------+----------+--------------+  FV Distal Full                                                             +---------+---------------+---------+-----------+----------+--------------+  PFV       Full                                                             +---------+---------------+---------+-----------+----------+--------------+  POP       Full                                             pulsatile       +---------+---------------+---------+-----------+----------+--------------+  PTV       Full                                                             +---------+---------------+---------+-----------+----------+--------------+  PERO      Full                                                              +---------+---------------+---------+-----------+----------+--------------+     Summary: RIGHT: - There is no evidence of deep vein thrombosis in the lower extremity.  LEFT: - There is no evidence of deep vein thrombosis in the lower extremity.  *See table(s) above for measurements and observations. Electronically signed by Ruta Hinds MD on 06/12/2020 at 7:40:59 PM.    Final      LOS: 4 days   Antonieta Pert, MD [facility]  06/13/2020, 11:30 AM

## 2020-06-13 NOTE — Plan of Care (Signed)
°  Problem: Clinical Measurements: °Goal: Ability to maintain clinical measurements within normal limits will improve °Outcome: Progressing °Goal: Cardiovascular complication will be avoided °Outcome: Progressing °  °Problem: Activity: °Goal: Risk for activity intolerance will decrease °Outcome: Progressing °  °Problem: Nutrition: °Goal: Adequate nutrition will be maintained °Outcome: Progressing °  °

## 2020-06-13 NOTE — Progress Notes (Signed)
NAME:  Justin Mccarty, MRN:  [id number], DOB:  1936-06-30, LOS: 4 ADMISSION DATE:  06/09/2020, CONSULTATION DATE:  06/10/20 REFERRING MD:  Dana Allan, MD CHIEF COMPLAINT:  Shortness of breath   Brief History   Azaiah Licciardi is an 84 year old male, never smoker with history of GERD who has been following in pulmonary clinic for cough and shortness of breath over recent months. He has been diagnosed with non-specific interstitial pneumonia based on CT imaging in July and October with pulmonary function tests showing mild restrictive defect and mild diffusion defect.     Inflammatory workup is negative for ANA, RA, non elevated ESR and negative hypersensitivity panel. 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       He has continued to have progressive symptoms over recent months and comes in acute short of breath over the past 2 weeks. He has new oxygen requirement 2-3L at rest. He has productive cough at this time when it is typically dry.     He has been found to be in atrial fibrillation with RVR requiring diltiazem drip.     Chest radiograph is concerning for new scattered bilateral opacities.    BNP is 745, LDH 279, Cr 2.04 (baseline 1.4).    Echo shows EF 45-50%, global hypokinesis. RV systolic function is mildly reduced. RV size is moderately enlarged. Moderately elevated RVSP at 47.54mHg. Left atrial size mildly dilated. Right atrial size was moderately dilated.    Consults:  Cardiology PCCM  Procedures:  n/a  Significant Diagnostic Tests:  CXR 12/10 - interval development of multifocal airspace opacities bilaterally Procalcitonin 12/11 - 0.69 CRP 12.7   Micro Data:  Extended Respiratory Pathogen Panel 12/11 - Negative   Antimicrobials:  Ceftriaxone 12/10>>  Azithromycin 12/10>>  Interim history/subjective:  No events. Wife thinks he is getting better. Still needing oxygen.  Objective   Blood pressure (!)  114/41, pulse 87, temperature (!) 97.5 F (36.4 C), temperature source Axillary, resp. rate (!) 22, height _0  (1.702 m), weight 83.2 kg, SpO2 90 %.        Intake/Output Summary (Last 24 hours) at 06/13/2020 06222Last data filed at 06/13/2020 09798Gross per 24 hour  Intake --  Output 640 ml  Net -640 ml   Filed Weights   06/09/20 1554 06/10/20 0007  Weight: 83 kg 83.2 kg    Examination: Constitutional: chronically ill man in NAD  Eyes: EOMI, pupils equal Ears, nose, mouth, and throat: MMM, trachea midline Cardiovascular: Irregular, tachycardic, ext warm Respiratory: Wheezing bilaterally, mildly tachypniec Gastrointestinal: Soft, +BS Skin: No rashes, normal turgor Neurologic: mumbled speech, globally weak, 2+ pitting edema in ext Psychiatric: oriented  CXR stable personally reviewed  Resolved Hospital Problem list     Assessment:  Acute Hypoxemic Respiratory Failure -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 -There are worsening infiltrates on CXR, differential infection/fluid/inflammation, all 3 being treated -Echo, labs most c/w acute on chronic RV failure with group 2/3 pulmonary HTN.  LE duplex benign. - Muscular deconditioning not helping - Overall biggest barrier is his lack of mobility  Delirium- related to steroids and unfamiliar setting, stablle  Afib- related to RV failure, volume overload, respiratory issues  AKI on CKD- question cardiorenal  Plan:  - Continue to push diuresis as tolerated by renal function - Continue qHS seroquel - 2 week steroid taper as ordered - Dilt/AC per cardiology - Wean O2 to maintain sats > 90%, home O2 eval  as I suspect he will need going home - Start breo, dc pulmicort - PT consult, IS/flutter - AC and rate control per cardiology - Will follow with you, hopefully home tomorrow depending on how he looks  Erskine Emery MD PCCM

## 2020-06-14 LAB — MAGNESIUM: Magnesium: 2.1 mg/dL (ref 1.7–2.4)

## 2020-06-14 LAB — COMPREHENSIVE METABOLIC PANEL
ALT: 175 U/L — ABNORMAL HIGH (ref 0–44)
AST: 46 U/L — ABNORMAL HIGH (ref 15–41)
Albumin: 2.4 g/dL — ABNORMAL LOW (ref 3.5–5.0)
Alkaline Phosphatase: 86 U/L (ref 38–126)
Anion gap: 9 (ref 5–15)
BUN: 51 mg/dL — ABNORMAL HIGH (ref 8–23)
CO2: 33 mmol/L — ABNORMAL HIGH (ref 22–32)
Calcium: 8.3 mg/dL — ABNORMAL LOW (ref 8.9–10.3)
Chloride: 97 mmol/L — ABNORMAL LOW (ref 98–111)
Creatinine, Ser: 1.82 mg/dL — ABNORMAL HIGH (ref 0.61–1.24)
GFR, Estimated: 37 mL/min — ABNORMAL LOW (ref >60)
Glucose, Bld: 136 mg/dL — ABNORMAL HIGH (ref 70–99)
Potassium: 4.8 mmol/L (ref 3.5–5.1)
Sodium: 139 mmol/L (ref 135–145)
Total Bilirubin: 0.7 mg/dL (ref 0.3–1.2)
Total Protein: 5.8 g/dL — ABNORMAL LOW (ref 6.5–8.1)

## 2020-06-14 LAB — CBC
HCT: 33.5 % — ABNORMAL LOW (ref 39.0–52.0)
Hemoglobin: 11.1 g/dL — ABNORMAL LOW (ref 13.0–17.0)
MCH: 31 pg (ref 26.0–34.0)
MCHC: 33.1 g/dL (ref 30.0–36.0)
MCV: 93.6 fL (ref 80.0–100.0)
Platelets: 249 10*3/uL (ref 150–400)
RBC: 3.58 MIL/uL — ABNORMAL LOW (ref 4.22–5.81)
RDW: 14.4 % (ref 11.5–15.5)
WBC: 12 10*3/uL — ABNORMAL HIGH (ref 4.0–10.5)
nRBC: 0 % (ref 0.0–0.2)

## 2020-06-14 MED ORDER — FUROSEMIDE 40 MG PO TABS: 40.0000 mg | ORAL_TABLET | Freq: Every day | ORAL | Status: DC

## 2020-06-14 MED ORDER — FUROSEMIDE 40 MG PO TABS: 40.0000 mg | ORAL_TABLET | Freq: Every day | ORAL | 1 refills | Status: AC

## 2020-06-14 MED ORDER — DILTIAZEM HCL ER COATED BEADS 180 MG PO CP24: 180.0000 mg | ORAL_CAPSULE | Freq: Every day | ORAL | 1 refills | Status: DC

## 2020-06-14 MED ORDER — PANTOPRAZOLE SODIUM 40 MG PO TBEC: 40.0000 mg | DELAYED_RELEASE_TABLET | Freq: Every day | ORAL | 0 refills | Status: DC

## 2020-06-14 MED ORDER — AZITHROMYCIN 500 MG PO TABS: 500.0000 mg | ORAL_TABLET | Freq: Every day | ORAL | 0 refills | Status: AC

## 2020-06-14 MED ORDER — PREDNISONE 20 MG PO TABS: ORAL_TABLET | ORAL | 0 refills | Status: AC

## 2020-06-14 MED ORDER — CEFDINIR 300 MG PO CAPS: 300.0000 mg | ORAL_CAPSULE | Freq: Two times a day (BID) | ORAL | 0 refills | Status: AC

## 2020-06-14 MED ORDER — PANTOPRAZOLE SODIUM 40 MG PO TBEC: 40.0000 mg | DELAYED_RELEASE_TABLET | Freq: Every day | ORAL | 0 refills | Status: AC

## 2020-06-14 MED ORDER — APIXABAN 2.5 MG PO TABS: 2.5000 mg | ORAL_TABLET | Freq: Two times a day (BID) | ORAL | 0 refills | Status: AC

## 2020-06-14 MED ORDER — DILTIAZEM HCL ER COATED BEADS 180 MG PO CP24: 180.0000 mg | ORAL_CAPSULE | Freq: Every day | ORAL | 0 refills | Status: AC

## 2020-06-14 MED ORDER — PREDNISONE 20 MG PO TABS: ORAL_TABLET | ORAL | 0 refills | Status: DC

## 2020-06-14 NOTE — Progress Notes (Signed)
NAME:  Justin Mccarty, MRN:  [id number], DOB:  [birth year], LOS: 5 ADMISSION DATE:  06/09/2020, CONSULTATION DATE:  06/10/20 REFERRING MD:  Justin Allan, MD CHIEF COMPLAINT:  Shortness of breath   Brief History   Justin Mccarty is an 84 year old male, never smoker with history of GERD who has been following in pulmonary clinic for cough and shortness of breath over recent months. He has been diagnosed with non-specific interstitial pneumonia based on CT imaging in July and October with pulmonary function tests showing mild restrictive defect and mild diffusion defect.     Inflammatory workup is negative for ANA, RA, non elevated ESR and negative hypersensitivity panel. 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       He has continued to have progressive symptoms over recent months and comes in acute short of breath over the past 2 weeks. He has new oxygen requirement 2-3L at rest. He has productive cough at this time when it is typically dry.     He has been found to be in atrial fibrillation with RVR requiring diltiazem drip.     Chest radiograph is concerning for new scattered bilateral opacities.    BNP is 745, LDH 279, Cr 2.04 (baseline 1.4).    Echo shows EF 45-50%, global hypokinesis. RV systolic function is mildly reduced. RV size is moderately enlarged. Moderately elevated RVSP at 47.36mHg. Left atrial size mildly dilated. Right atrial size was moderately dilated.    Consults:  Cardiology PCCM  Procedures:  n/a  Significant Diagnostic Tests:  CXR 12/10 - interval development of multifocal airspace opacities bilaterally Procalcitonin 12/11 - 0.69 CRP 12.7   Micro Data:  Extended Respiratory Pathogen Panel 12/11 - Negative   Antimicrobials:  Ceftriaxone 12/10>>  Azithromycin 12/10>>  Interim history/subjective:  Looks better! Continues to diurese well.  Objective   Blood pressure 108/71, pulse 74, temperature 98.4  F (36.9 C), temperature source Oral, resp. rate 18, height _0  (1.702 m), weight 82.4 kg, SpO2 95 %.        Intake/Output Summary (Last 24 hours) at 06/14/2020 1315 Last data filed at 06/14/2020 1100 Gross per 24 hour  Intake 240 ml  Output 2025 ml  Net -1785 ml   Filed Weights   06/09/20 1554 06/10/20 0007 06/14/20 0530  Weight: 83 kg 83.2 kg 82.4 kg    Examination: Constitutional: no acute distress  Eyes: EOMI, pupils equal Ears, nose, mouth, and throat: MMM, trachea midline Cardiovascular: irregular, ext warm Respiratory: continued scattered wheezing/rhonci but overall better air movement Gastrointestinal: soft, +BS Skin: No rashes, normal turgor Neurologic: moves all 4 ext to command Psychiatric: RASS 0   Cr improving  Resolved Hospital Problem list     Assessment:  Acute Hypoxemic Respiratory Failure -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 -There are worsening infiltrates on CXR, differential infection/fluid/inflammation, all 3 being treated -Echo, labs most c/w acute on chronic RV failure with group 2/3 pulmonary HTN.  LE duplex benign. - Muscular deconditioning not helping - Overall biggest barrier is his lack of mobility which hopefully will improve as he gets home  Afib- related to RV failure, volume overload, respiratory issues  AKI on CKD- improved with diuresis  Plan:  - Would send him home on PO diuretics and have BMP checked early next week, defer dosing to cardiology - Continue qHS seroquel - 2 week steroid taper as ordered - Dilt/AC per cardiology - Wean O2 to maintain sats >  90%, home O2 eval as I suspect he will need going home - Continue breo - PT consult, IS/flutter - AC and rate control per cardiology - Home O2 eval done, would have him use 4L at night and with activity - Okay for home from my standpoint, has f/u with Dr. Erin Mccarty on 07/04/20 at 12:00 - We are available PRN  Justin Emery MD PCCM

## 2020-06-14 NOTE — Progress Notes (Signed)
Occupational Therapy Treatment Patient Details Name: Justin Mccarty MRN: [id number] DOB: [birth year] Today's Date: 06/14/2020    History of present illness Pt is 84 yo male with PMH including pulmonary fibrosis, GERD, hyponatremia, anemia, who was sent to ER by pulmonologist after O2 sats were 78%.  Pt has been admitted with afib with RVR, possible multifocal PNE, pulmonary fibrosis, and possible acute on chronic CHF.   OT comments  Pt making good progress with functional goals. Pt ambulated to bathroom with RW for toilet transfers and toileting tasks, stood at sink for grooming and LB ADLs. Pt very pleasant and cooperative and eager to work with therapy. OT will continue to follow acutely to maximize level of function and safety  Follow Up Recommendations  No OT follow up;Supervision - Intermittent    Equipment Recommendations  None recommended by OT    Recommendations for Other Services      Precautions / Restrictions Precautions Precautions: Other (comment) Precaution Comments: monitor O2 Restrictions Weight Bearing Restrictions: No       Mobility Bed Mobility Overal bed mobility: Needs Assistance Bed Mobility: Supine to Sit     Supine to sit: Supervision        Transfers Overall transfer level: Modified independent Equipment used: 4-wheeled walker Transfers: Sit to/from Stand Sit to Stand: Modified independent (Device/Increase time)         General transfer comment: cues for correct hand placement    Balance Overall balance assessment: Needs assistance Sitting-balance support: No upper extremity supported Sitting balance-Leahy Scale: Good     Standing balance support: No upper extremity supported;During functional activity Standing balance-Leahy Scale: Fair                             ADL either performed or assessed with clinical judgement   ADL Overall ADL's : Needs assistance/impaired     Grooming: Wash/dry hands;Wash/dry  face;Supervision/safety       Lower Body Bathing: Min guard;Sit to/from stand;With caregiver independent assisting       Lower Body Dressing: Min guard;Sit to/from stand;With caregiver independent assisting   Toilet Transfer: Supervision/safety;Ambulation;RW;Cueing for safety;Comfort height toilet;Grab bars   Toileting- Clothing Manipulation and Hygiene: Supervision/safety;Sit to/from stand       Functional mobility during ADLs: Supervision/safety;Cueing for safety;Rolling walker       Vision Baseline Vision/History: Wears glasses Patient Visual Report: No change from baseline     Perception     Praxis      Cognition Arousal/Alertness: Awake/alert Behavior During Therapy: WFL for tasks assessed/performed Overall Cognitive Status: Within Functional Limits for tasks assessed                                          Exercises     Shoulder Instructions       General Comments      Pertinent Vitals/ Pain       Pain Assessment: No/denies pain  Home Living                                          Prior Functioning/Environment              Frequency  Min 2X/week        Progress Toward Goals  OT  Goals(current goals can now be found in the care plan section)  Progress towards OT goals: Progressing toward goals  Acute Rehab OT Goals Patient Stated Goal: return home  Plan Discharge plan remains appropriate    Co-evaluation                 AM-PAC OT "6 Clicks" Daily Activity     Outcome Measure   Help from another person eating meals?: None Help from another person taking care of personal grooming?: None Help from another person toileting, which includes using toliet, bedpan, or urinal?: A Little Help from another person bathing (including washing, rinsing, drying)?: A Little Help from another person to put on and taking off regular upper body clothing?: None Help from another person to put on and taking  off regular lower body clothing?: A Little 6 Click Score: 21    End of Session Equipment Utilized During Treatment: Gait belt;Rolling walker;Oxygen  OT Visit Diagnosis: Other abnormalities of gait and mobility (R26.89);Muscle weakness (generalized) (M62.81)   Activity Tolerance Patient tolerated treatment well   Patient Left in chair;with call bell/phone within reach;with family/visitor present   Nurse Communication          Time: 0962-8366 OT Time Calculation (min): 25 min  Charges: OT General Charges $OT Visit: 1 Visit OT Treatments $Self Care/Home Management : 8-22 mins $Therapeutic Activity: 8-22 mins     Galen Manila 06/14/2020, 1:10 PM

## 2020-06-14 NOTE — Progress Notes (Addendum)
Progress Note  Patient Name: Justin Mccarty Date of Encounter: 06/14/2020  [facility] Cardiologist: Charlton Haws, MD   Subjective   Breathing improved significantly since yesterday. Diuresed 2.2L in past 24 hours. No chest pain.   Inpatient Medications    Scheduled Meds: . apixaban  2.5 mg Oral BID  . diltiazem  180 mg Oral Daily  . fluticasone furoate-vilanterol  1 puff Inhalation Daily  . pantoprazole  40 mg Oral Daily  . predniSONE  40 mg Oral Q breakfast   Followed by  . [START ON 06/21/2020] predniSONE  20 mg Oral Q breakfast  . QUEtiapine  25 mg Oral QHS   Continuous Infusions: . azithromycin 500 mg (06/13/20 1820)  . cefTRIAXone (ROCEPHIN)  IV 2 g (06/13/20 1813)   PRN Meds: guaiFENesin-dextromethorphan, levalbuterol, LORazepam   Vital Signs    Vitals:   06/14/20 0530 06/14/20 0700 06/14/20 0719 06/14/20 0743  BP:    108/71  Pulse: 89 96 86 74  Resp:    18  Temp:    98.4 F (36.9 C)  TempSrc:    Oral  SpO2: 98% 97% (!) 87% 92%  Weight: 82.4 kg     Height:        Intake/Output Summary (Last 24 hours) at 06/14/2020 0804 Last data filed at 06/14/2020 0657 Gross per 24 hour  Intake --  Output 1825 ml  Net -1825 ml   Last 3 Weights 06/14/2020 06/10/2020 06/09/2020  Weight (lbs) 181 lb 10.5 oz 183 lb 6.8 oz 182 lb 15.7 oz  Weight (kg) 82.4 kg 83.2 kg 83 kg      Telemetry    Atrial fibrillation in 90s-100s this morning - Personally Reviewed  ECG    N/A  Physical Exam   GEN: No acute distress.   Neck: No JVD Cardiac: irregular, no murmurs, rubs, or gallops.  Respiratory: expiratory wheezing  GI: Soft, nontender, non-distended  MS: Trace  edema; No deformity. Neuro:  Nonfocal  Psych: Normal affect   Labs    High Sensitivity Troponin:   Recent Labs  Lab 06/09/20 1635 06/09/20 1832  TROPONINIHS 40* 39*      Chemistry Recent Labs  Lab 06/10/20 0034 06/11/20 0556 06/11/20 1428 06/12/20 0753 06/13/20 0313 06/14/20 0216  NA  129* 133*   < > 133* 135 139  K 5.1 4.8   < > 4.9 5.0 4.8  CL 98 98   < > 97* 100 97*  CO2 21* 25   < > 24 25 33*  GLUCOSE 213* 140*   < > 130* 122* 136*  BUN 54* 57*   < > 56* 57* 51*  CREATININE 2.04* 2.02*   < > 1.98* 2.00* 1.82*  CALCIUM 8.6* 8.3*   < > 8.2* 8.0* 8.3*  PROT 6.6 6.1*  --   --   --  5.8*  ALBUMIN 2.5* 2.4*  --   --   --  2.4*  AST 404* 139*  --   --   --  46*  ALT 495* 336*  --   --   --  175*  ALKPHOS 122 106  --   --   --  86  BILITOT 0.7 0.6  --   --   --  0.7  GFRNONAA 32* 32*   < > 33* 33* 37*  ANIONGAP 10 10   < > 12 10 9    < > = values in this interval not displayed.     Hematology Recent  Labs  Lab 06/12/20 0628 06/13/20 0313 06/14/20 0216  WBC 14.6* 11.6* 12.0*  RBC 3.55* 3.22* 3.58*  HGB 10.8* 10.1* 11.1*  HCT 33.9* 30.2* 33.5*  MCV 95.5 93.8 93.6  MCH 30.4 31.4 31.0  MCHC 31.9 33.4 33.1  RDW 13.9 14.1 14.4  PLT 283 213 249    BNP Recent Labs  Lab 06/09/20 1634  BNP 745.0*      Radiology    DG Chest 1 View  Result Date: 06/13/2020 CLINICAL DATA:  Interstitial lung disease. Shortness of breath. Wheezing. EXAM: CHEST  1 VIEW COMPARISON:  Chest x-ray 06/09/2020, 11/18/2019. Chest CT 04/04/2020. FINDINGS: Mediastinum and hilar structures normal. Stable mild cardiomegaly. No pulmonary venous congestion. Persistent right upper lobe and left base prominent infiltrates. New mild infiltrate right lung base. Underlying chronic interstitial lung disease. Tiny bilateral pleural effusions. Progressive right apical pleural thickening noted. A developing right apical pleural fluid collection cannot be excluded. No pneumothorax. Previously identified left upper lobe nodule noted on chest CT of 04/04/2020 not identified by chest x-ray. Reference is made to prior CT report 04/04/2020. Degenerative change thoracic spine. IMPRESSION: 1. Persistent right upper lobe and left base prominent infiltrates. New mild infiltrate right lung base. Tiny bilateral pleural  effusions. Progressive right apical pleural thickening. A developing right apical pleural fluid collection cannot be excluded. 2. Underlying chronic interstitial lung disease. 3. Previously identified left upper lobe nodule noted on chest CT of 04/04/2020 not identified by chest x-ray. Reference is made to prior CT report of 04/04/2020. Electronically Signed   By: Maisie Fus  Register   On: 06/13/2020 06:12    Cardiac Studies   Echo 06/10/2020 1. Left ventricular ejection fraction, by estimation, is 45 to 50%. The  left ventricle has mildly decreased function. The left ventricle  demonstrates global hypokinesis. There is moderate concentric left  ventricular hypertrophy. Left ventricular  diastolic function could not be evaluated. There is the interventricular  septum is flattened in systole and diastole, consistent with right  ventricular pressure and volume overload.  2. Right ventricular systolic function is moderately reduced. The right  ventricular size is moderately enlarged. There is moderately elevated  pulmonary artery systolic pressure. The estimated right ventricular  systolic pressure is 47.7 mmHg.  3. Left atrial size was mildly dilated.  4. Right atrial size was moderately dilated.  5. The mitral valve is normal in structure. No evidence of mitral valve  regurgitation. No evidence of mitral stenosis.  6. The aortic valve is normal in structure. Aortic valve regurgitation is  mild. Mild to moderate aortic valve sclerosis/calcification is present,  without any evidence of aortic stenosis.  7. The inferior vena cava is dilated in size with <50% respiratory  variability, suggesting right atrial pressure of 15 mmHg.   Patient Profile     84 y.o. male with a hx ofnonspecific ILD follow by pulmonary and GERD whois being seen for the evaluation of atrial fibrillation with RVR and acute CHF in setting of acute hypoxic respiratory failure.  Assessment & Plan    1. PAF - Felt  2nd to underlying lung dz - Heart rate improved on IV Cardizem and transitioned to Po Cardizem and up titrated to 180 mg daily >>> He remained in afib with HR in 80s however HR in 90-100s currently with morning routine.  - Treated with IV heparin for anticoagulation initially and transitioned to Eliquis 2.5mg  BID as no planned procedure -No BB given lung disease  2.  Acute on chronic combined CHF -  Echocardiogram this admission showed LV function of 45 to 50% and  moderate LVH. There is the interventricular  septum is flattened in systole and diastole, consistent with right  ventricular pressure and volume overload. Reduced RV function.  -Treated with IV Lasix 40 mg twice daily for diuresis>>> net I&O 5.3 L  With 2.2L in past 24 hours.   - No daily weight - Le edema almost resolved. -Cardiomyopathy likely due to intermittent tachycardia from A. Fib -No beta-blocker given lung disease -No ACE/ARB due to CKD - Will give lasix 40mg  this AM and  review further dosage with MD (he was getting IV lasix 40mg  BID)   3.  AKI with underlying chronic kidney disease with possible stage III -Unknown baseline - Renal function stable around 2 - SCr improved to 1.82 today.   4.  Acute hypoxic respiratory failure, possible multifocal pneumonia -Per primary/pulmonary team   [facility] will sign off.   Medication Recommendations:  Eliquis 2.5 mg BID, diltiazem 180 mg daily, lasix 40 mg daily Other recommendations (labs, testing, etc):  BMET within 1 week.  Advised to check daily weights, call if up more than 3lbs in 1 day or 5 lbs in 1 week Follow up as an outpatient:  Will schedule   For questions or updates, please contact [facility] Please consult [email or web address] for contact info under      Signed , PA  06/14/2020, 8:04 AM    Patient seen and examined.  Agree with above documentation.  On exam, patient is alert and oriented, irregular, normal rate, no murmurs, diffuse  expiratory wheezing, 1+ LE edema.  Continue Eliquis and diltiazem.  Will switch to PO lasix.  We will sign off, please call if we can be of any assistance.  Manson Passey, MD

## 2020-06-14 NOTE — Discharge Instructions (Signed)
Atrial Fibrillation  Atrial fibrillation is a type of heartbeat that is irregular or fast. If you have this condition, your heart beats without any order. This makes it hard for your heart to pump blood in a normal way. Atrial fibrillation may come and go, or it may become a long-lasting problem. If this condition is not treated, it can put you at higher risk for stroke, heart failure, and other heart problems. What are the causes? This condition may be caused by diseases that damage the heart. They include:  High blood pressure.  Heart failure.  Heart valve disease.  Heart surgery. Other causes include:  Diabetes.  Thyroid disease.  Being overweight.  Kidney disease. Sometimes the cause is not known. What increases the risk? You are more likely to develop this condition if:  You are older.  You smoke.  You exercise often and very hard.  You have a family history of this condition.  You are a man.  You use drugs.  You drink a lot of alcohol.  You have lung conditions, such as emphysema, pneumonia, or COPD.  You have sleep apnea. What are the signs or symptoms? Common symptoms of this condition include:  A feeling that your heart is beating very fast.  Chest pain or discomfort.  Feeling short of breath.  Suddenly feeling light-headed or weak.  Getting tired easily during activity.  Fainting.  Sweating. In some cases, there are no symptoms. How is this treated? Treatment for this condition depends on underlying conditions and how you feel when you have atrial fibrillation. They include:  Medicines to: ? Prevent blood clots. ? Treat heart rate or heart rhythm problems.  Using devices, such as a pacemaker, to correct heart rhythm problems.  Doing surgery to remove the part of the heart that sends bad signals.  Closing an area where clots can form in the heart (left atrial appendage). In some cases, your doctor will treat other underlying  conditions. Follow these instructions at home: Medicines  Take over-the-counter and prescription medicines only as told by your doctor.  Do not take any new medicines without first talking to your doctor.  If you are taking blood thinners: ? Talk with your doctor before you take any medicines that have aspirin or NSAIDs, such as ibuprofen, in them. ? Take your medicine exactly as told by your doctor. Take it at the same time each day. ? Avoid activities that could hurt or bruise you. Follow instructions about how to prevent falls. ? Wear a bracelet that says you are taking blood thinners. Or, carry a card that lists what medicines you take. Lifestyle      Do not use any products that have nicotine or tobacco in them. These include cigarettes, e-cigarettes, and chewing tobacco. If you need help quitting, ask your doctor.  Eat heart-healthy foods. Talk with your doctor about the right eating plan for you.  Exercise regularly as told by your doctor.  Do not drink alcohol.  Lose weight if you are overweight.  Do not use drugs, including cannabis. General instructions  If you have a condition that causes breathing to stop for a short period of time (apnea), treat it as told by your doctor.  Keep a healthy weight. Do not use diet pills unless your doctor says they are safe for you. Diet pills may make heart problems worse.  Keep all follow-up visits as told by your doctor. This is important. Contact a doctor if:  You notice a  change in the speed, rhythm, or strength of your heartbeat.  You are taking a blood-thinning medicine and you get more bruising.  You get tired more easily when you move or exercise.  You have a sudden change in weight. Get help right away if:   You have pain in your chest or your belly (abdomen).  You have trouble breathing.  You have side effects of blood thinners, such as blood in your vomit, poop (stool), or pee (urine), or bleeding that cannot  stop.  You have any signs of a stroke. "BE FAST" is an easy way to remember the main warning signs: ? B - Balance. Signs are dizziness, sudden trouble walking, or loss of balance. ? E - Eyes. Signs are trouble seeing or a change in how you see. ? F - Face. Signs are sudden weakness or loss of feeling in the face, or the face or eyelid drooping on one side. ? A - Arms. Signs are weakness or loss of feeling in an arm. This happens suddenly and usually on one side of the body. ? S - Speech. Signs are sudden trouble speaking, slurred speech, or trouble understanding what people say. ? T - Time. Time to call emergency services. Write down what time symptoms started.  You have other signs of a stroke, such as: ? A sudden, very bad headache with no known cause. ? Feeling like you may vomit (nausea). ? Vomiting. ? A seizure. These symptoms may be an emergency. Do not wait to see if the symptoms will go away. Get medical help right away. Call your local emergency services (911 in the [place] Do not drive yourself to the hospital. Summary  Atrial fibrillation is a type of heartbeat that is irregular or fast.  You are at higher risk of this condition if you smoke, are older, have diabetes, or are overweight.  Follow your doctor's instructions about medicines, diet, exercise, and follow-up visits.  Get help right away if you have signs or symptoms of a stroke.  Get help right away if you cannot catch your breath, or you have chest pain or discomfort. This information is not intended to replace advice given to you by your health care provider. Make sure you discuss any questions you have with your health care provider. Document Revised: 12/09/2018 Document Reviewed: 12/09/2018 Elsevier Patient Education  2020 [employer] Inc.   Community-Acquired Pneumonia, Adult Pneumonia is an infection of the lungs. It causes swelling in the airways of the lungs. Mucus and fluid may also build up inside the  airways. One type of pneumonia can happen while a person is in a hospital. A different type can happen when a person is not in a hospital (community-acquired pneumonia).  What are the causes?  This condition is caused by germs (viruses, bacteria, or fungi). Some types of germs can be passed from one person to another. This can happen when you breathe in droplets from the cough or sneeze of an infected person. What increases the risk? You are more likely to develop this condition if you:  Have a long-term (chronic) disease, such as: ? Chronic obstructive pulmonary disease (COPD). ? Asthma. ? Cystic fibrosis. ? Congestive heart failure. ? Diabetes. ? Kidney disease.  Have HIV.  Have sickle cell disease.  Have had your spleen removed.  Do not take good care of your teeth and mouth (poor dental hygiene).  Have a medical condition that increases the risk of breathing in droplets from your own mouth and  nose.  Have a weakened body defense system (immune system).  Are a smoker.  Travel to areas where the germs that cause this illness are common.  Are around certain animals or the places they live. What are the signs or symptoms?  A dry cough.  A wet (productive) cough.  Fever.  Sweating.  Chest pain. This often happens when breathing deeply or coughing.  Fast breathing or trouble breathing.  Shortness of breath.  Shaking chills.  Feeling tired (fatigue).  Muscle aches. How is this treated? Treatment for this condition depends on many things. Most adults can be treated at home. In some cases, treatment must happen in a hospital. Treatment may include:  Medicines given by mouth or through an IV tube.  Being given extra oxygen.  Respiratory therapy. In rare cases, treatment for very bad pneumonia may include:  Using a machine to help you breathe.  Having a procedure to remove fluid from around your lungs. Follow these instructions at home: Medicines  Take  over-the-counter and prescription medicines only as told by your doctor. ? Only take cough medicine if you are losing sleep.  If you were prescribed an antibiotic medicine, take it as told by your doctor. Do not stop taking the antibiotic even if you start to feel better. General instructions   Sleep with your head and neck raised (elevated). You can do this by sleeping in a recliner or by putting a few pillows under your head.  Rest as needed. Get at least 8 hours of sleep each night.  Drink enough water to keep your pee (urine) pale yellow.  Eat a healthy diet that includes plenty of vegetables, fruits, whole grains, low-fat dairy products, and lean protein.  Do not use any products that contain nicotine or tobacco. These include cigarettes, e-cigarettes, and chewing tobacco. If you need help quitting, ask your doctor.  Keep all follow-up visits as told by your doctor. This is important. How is this prevented? A shot (vaccine) can help prevent pneumonia. Shots are often suggested for:  People older than 82 years of age.  People older than 82 years of age who: ? Are having cancer treatment. ? Have long-term (chronic) lung disease. ? Have problems with their body's defense system. You may also prevent pneumonia if you take these actions:  Get the flu (influenza) shot every year.  Go to the dentist as often as told.  Wash your hands often. If you cannot use soap and water, use hand sanitizer. Contact a doctor if:  You have a fever.  You lose sleep because your cough medicine does not help. Get help right away if:  You are short of breath and it gets worse.  You have more chest pain.  Your sickness gets worse. This is very serious if: ? You are an older adult. ? Your body's defense system is weak.  You cough up blood. Summary  Pneumonia is an infection of the lungs.  Most adults can be treated at home. Some will need treatment in a hospital.  Drink enough water  to keep your pee pale yellow.  Get at least 8 hours of sleep each night. This information is not intended to replace advice given to you by your health care provider. Make sure you discuss any questions you have with your health care provider. Document Revised: 10/07/2018 Document Reviewed: 02/12/2018 Elsevier Patient Education  2020 [employer]. Information on my medicine - ELIQUIS (apixaban)  This medication education was reviewed with me or  my healthcare representative as part of my discharge preparation  Why was Eliquis prescribed for you? Eliquis was prescribed for you to reduce the risk of forming blood clots that can cause a stroke if you have a medical condition called atrial fibrillation (a type of irregular heartbeat) OR to reduce the risk of a blood clots forming after orthopedic surgery.  What do You need to know about Eliquis ? Take your Eliquis TWICE DAILY - one tablet in the morning and one tablet in the evening with or without food.  It would be best to take the doses about the same time each day.  If you have difficulty swallowing the tablet whole please discuss with your pharmacist how to take the medication safely.  Take Eliquis exactly as prescribed by your doctor and DO NOT stop taking Eliquis without talking to the doctor who prescribed the medication.  Stopping may increase your risk of developing a new clot or stroke.  Refill your prescription before you run out.  After discharge, you should have regular check-up appointments with your healthcare provider that is prescribing your Eliquis.  In the future your dose may need to be changed if your kidney function or weight changes by a significant amount or as you get older.  What do you do if you miss a dose? If you miss a dose, take it as soon as you remember on the same day and resume taking twice daily.  Do not take more than one dose of ELIQUIS at the same time.  Important Safety Information A possible  side effect of Eliquis is bleeding. You should call your healthcare provider right away if you experience any of the following: ? Bleeding from an injury or your nose that does not stop. ? Unusual colored urine (red or dark brown) or unusual colored stools (red or black). ? Unusual bruising for unknown reasons. ? A serious fall or if you hit your head (even if there is no bleeding).  Some medicines may interact with Eliquis and might increase your risk of bleeding or clotting while on Eliquis. To help avoid this, consult your healthcare provider or pharmacist prior to using any new prescription or non-prescription medications, including herbals, vitamins, non-steroidal anti-inflammatory drugs (NSAIDs) and supplements.  This website has more information on Eliquis (apixaban): [email or web address].cy.

## 2020-06-14 NOTE — TOC Initial Note (Signed)
Transition of Care [facility]) - Initial/Assessment Note    Patient Details  Name: Justin Mccarty MRN: [id number] Date of Birth: 02-Apr-1936  [facility] CM/SW Contact:    Gala Lewandowsky, RN Phone Number: 06/14/2020, 4:44 PM  Clinical Narrative: Patient to transition home with home health services. Discussed Medicare.gov list with patient and spouse. [facility] has used [facility] past and wanted to use again. [facility] notified and were unable to service the patient. Case Manager then called [facility] and they can service the patient. Start of care to begin within 24-48 hours post transition home. Durable Medical Equipment delivered via [facility]. Wife transporting patient home via private vehicle. No further needs from Case Manager                   Expected Discharge Plan: Home w Home Health Services Barriers to Discharge: No Barriers Identified   Patient Goals and CMS Choice Patient states their goals for this hospitalization and ongoing recovery are:: to return home with home health CMS Medicare.gov Compare Post Acute Care list provided to:: Patient Choice offered to / list presented to : [facility]  Expected Discharge Plan and Services Expected Discharge Plan: Home w Home Health Services In-house Referral: NA Discharge Planning Services: CM Consult Post Acute Care Choice: Home Health,Durable Medical Equipment Living arrangements for the past 2 months: Single Family Home Expected Discharge Date: 06/14/20               DME Arranged: Oxygen DME Agency: [facility] Date DME Agency Contacted: 06/14/20 Time DME Agency Contacted: 1400 Representative spoke with at DME Agency: Velna Hatchet HH Arranged: PT HH Agency: [facility] Date [facility] Agency Contacted: 06/14/20 Time HH Agency Contacted: 1519 Representative spoke with at [facility] Agency: Kandee Keen  Prior Living Arrangements/Services Living arrangements for the past 2 months: Single Family Home Lives with::  Spouse Patient language and need for interpreter reviewed:: Yes Do you feel safe going back to the place where you live?: Yes      Need for Family Participation in Patient Care: Yes (Comment) Care giver support system in place?: Yes (comment)   Criminal Activity/Legal Involvement Pertinent to Current Situation/Hospitalization: No - Comment as needed  Activities of Daily Living Home Assistive Devices/Equipment: Eyeglasses ADL Screening (condition at time of admission) Patient's cognitive ability adequate to safely complete daily activities?: No Is the patient deaf or have difficulty hearing?: No Does the patient have difficulty seeing, even when wearing glasses/contacts?: No Does the patient have difficulty concentrating, remembering, or making decisions?: No Patient able to express need for assistance with ADLs?: No Does the patient have difficulty dressing or bathing?: No Independently performs ADLs?: Yes (appropriate for developmental age) Does the patient have difficulty walking or climbing stairs?: Yes Weakness of Legs: None Weakness of Arms/Hands: None  Permission Sought/Granted Permission sought to share information with : Family Environmental health practitioner Permission granted to share information with : Yes, Verbal Permission Granted     Permission granted to share info w AGENCY: [facility]        Emotional Assessment Appearance:: Appears stated age Attitude/Demeanor/Rapport: Engaged Affect (typically observed): Appropriate Orientation: : Oriented to Self,Oriented to Place,Oriented to  Time,Oriented to Situation Alcohol / Substance Use: Not Applicable Psych Involvement: No (comment)  Admission diagnosis:  New onset a-fib (HCC) [I48.91] Atrial fibrillation with rapid ventricular response (HCC) [I48.91] Multifocal pneumonia [J18.9] Patient Active Problem List   Diagnosis Date Noted  . Acute on chronic combined systolic and diastolic  CHF (congestive  heart failure) (HCC)   . New onset a-fib (HCC) 06/09/2020  . Community acquired pneumonia 06/09/2020  . Hyponatremia 06/09/2020  . AKI (acute kidney injury) (HCC) 06/09/2020  . Transaminitis 06/09/2020  . Sepsis (HCC) 06/09/2020  . Sinus bradycardia 08/25/2012   PCP:  Adrienne Mocha, PA Pharmacy:   [facility] [place] - [address] [place] [address] [address] [place] [address] Phone: [phone] Fax: [phone]  [facility] [place], [place] - [address] [place] [address] Phone: [phone] Fax: [phone]   Readmission Risk Interventions No flowsheet data found.

## 2020-06-14 NOTE — Discharge Summary (Addendum)
Physician Discharge Summary  Justin Justin Mccarty Justin Mccarty:[id number] DOB: 04/16/1936 DOA: 06/09/2020  PCP: Ephriam Jenkins, PA  Admit date: 06/09/2020 Discharge date: 06/14/2020  Disposition: Home  Recommendations for Outpatient Follow-up:  1. Follow up with pulmonology and cardiology as per schedule  2. CMP in 1 week.  Reason: Patient on Lasix as well as abnormal LFTs   Equipment/Devices: Oxygen via nasal cannula at 2 L/min.  Discharge Condition: Stable CODE STATUS: Full code Diet recommendation: Low-sodium diet  Brief/Interim Summary: 84 year old male with history of pulmonary fibrosis, GERD, hyponatremia, anemia sent to ED by his pulmonology for hypoxia with 78% on room air.   As per the report He has had progressive worsening of his symptoms.  For 2 weeks prior to admission he has gotten worse with cough and oxygen requirement which is new. Prior to admission he was noted to have irregular rhythm. He was placed on 6 L of oxygen and sent over to the ER. His initial oxygen sat in the ER was 89%. Patient found to be in A. fib with RVR. No prior documented history of A. fib. He was therefore  admitted with new onset A. fib with RVR and worsening hypoxemia. His chest x-ray indicated right lung pneumonia but has no history of dysphagia and denied any choking. No evidence that he may have had aspiration.. Acute respiratory panel was negative. Chest x-ray showed interval development of multifocal infection. Likely trace left pleural effusion also. Findings in the right upper lobe right middle lobe lingula and left lower lobe. He was started on IV ceftriaxone and Zithromax.  Pulmonary and cardiology was consulted.  He was kept on bronchodilators and diuresed with Lasix.  Was also kept on Eliquis.  Paroxysmal A-fib with RVR, he had a prior history of A. Fib. Now he is on Eliquis and Cardizem p.o.   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 Echo shows chronic RV failure with group 2/3 pulmonary hypertension, lower extremity duplex no DVT.  Continue with 2-week taper with prednisone as ordered.  His oxygen saturation dropped to 86% on room air and hence will be kept on 2 L of oxygen via nasal cannula on discharge.   Possible multifocal pneumonia patient met sepsis criteria on admission w/ tachycardia hr > 90, RR > 24 and, tachypnea and hypoxia: Continues current antibiotics as noted above.  Delirium .  Improving.    AKI on CKD stage III:  Obtain CMP in 2 weeks.  Last Labs          Recent Labs  Lab 06/10/20 0034 06/11/20 0556 06/11/20 1428 06/12/20 0753 06/13/20 0313  BUN 54* 57* 56* 56* 57*  CREATININE 2.04* 2.02* 2.03* 1.98* 2.00*     Acute on chronic combined systolic and diastolic CHF, EF 14/78 echo preserved felt to be volume overloaded with flattened interventricular septum therefore on Lasix.  Had significant ankle edema and overall improving with diuresis.   Transaminitis improving.  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  Acute Hepatitis panel negative.  Repeat LFT as outpatient.  Hyponatremia.  Improved.   Discharge Diagnoses:  Principal Problem:   New onset a-fib [facility]) Active Problems:   Community acquired pneumonia   Hyponatremia   AKI (acute kidney injury) ([place])   Transaminitis   Sepsis ([place])   Acute on chronic combined systolic and diastolic CHF (congestive heart failure) [facility])    Discharge Instructions  Discharge Instructions    (  HEART FAILURE PATIENTS) Call MD:  Anytime you have any of the following symptoms: 1) 3 pound weight gain in 24 hours or 5 pounds in 1 week 2) shortness of breath, with or without a dry hacking cough 3) swelling in the hands, feet or stomach 4) if you have to sleep on extra pillows at night in order to breathe.   Complete by: As directed    Amb referral  to AFIB Clinic   Complete by: As directed    Call MD for:  temperature >100.4   Complete by: As directed    Diet - low sodium heart healthy   Complete by: As directed    Increase activity slowly   Complete by: As directed      Allergies as of 06/14/2020      Reactions   Penicillin G Rash      Medication List    STOP taking these medications   sodium chloride HYPERTONIC 3 % nebulizer solution     TAKE these medications   albuterol (2.5 MG/3ML) 0.083% nebulizer solution Commonly known as: PROVENTIL Take 3 mLs (2.5 mg total) by nebulization every 6 (six) hours as needed for wheezing or shortness of breath.   apixaban 2.5 MG Tabs tablet Commonly known as: ELIQUIS Take 1 tablet (2.5 mg total) by mouth 2 (two) times daily.   azithromycin 500 MG tablet Commonly known as: Zithromax Take 1 tablet (500 mg total) by mouth daily for 3 days. Take 1 tablet daily for 3 days.   Breztri Aerosphere 160-9-4.8 MCG/ACT Aero Generic drug: Budeson-Glycopyrrol-Formoterol Inhale 2 puffs into the lungs in the morning and at bedtime. What changed: Another medication with the same name was removed. Continue taking this medication, and follow the directions you see here.   busPIRone 5 MG tablet Commonly known as: BUSPAR Take 1 tablet (5 mg total) by mouth 3 (three) times daily.   cefdinir 300 MG capsule Commonly known as: OMNICEF Take 1 capsule (300 mg total) by mouth 2 (two) times daily for 3 days.   diltiazem 180 MG 24 hr capsule Commonly known as: CARDIZEM CD Take 1 capsule (180 mg total) by mouth daily. Start taking on: June 15, 2020   FISH OIL PO Take 1 capsule by mouth 2 (two) times daily.   furosemide 40 MG tablet Commonly known as: LASIX Take 1 tablet (40 mg total) by mouth daily.   glucosamine-chondroitin 500-400 MG tablet Take 1 tablet by mouth in the morning and at bedtime.   HYDROcodone-homatropine 5-1.5 MG/5ML syrup Commonly known as: HYCODAN Take 5 mLs by mouth  every 6 (six) hours as needed for cough.   ICAPS AREDS 2 PO Take 1 capsule by mouth in the morning and at bedtime.   pantoprazole 40 MG tablet Commonly known as: PROTONIX Take 1 tablet (40 mg total) by mouth daily. Start taking on: June 15, 2020   predniSONE 20 MG tablet Commonly known as: DELTASONE Take 2 tablets (40 mg total) by mouth daily with breakfast for 7 days, THEN 1 tablet (20 mg total) daily with breakfast for 7 days. Start taking on: June 15, 2020   VITAMIN B 12 PO Take 2 capsules by mouth in the morning and at bedtime.   VITAMIN D PO Take 2 tablets by mouth daily.            Durable Medical Equipment  (From admission, onward)         Start     Ordered   06/14/20 1439  DME Oxygen  Once       Question Answer Comment  Length of Need 6 Months   Mode or (Route) Nasal cannula   Liters per Minute 2   Frequency Continuous (stationary and portable oxygen unit needed)   Oxygen conserving device Yes   Oxygen delivery system Gas      06/14/20 1439   06/14/20 1419  For home use only DME oxygen  Once       Question Answer Comment  Length of Need Lifetime   Mode or (Route) Nasal cannula   Liters per Minute 2   Frequency Continuous (stationary and portable oxygen unit needed)   Oxygen conserving device Yes   Oxygen delivery system Gas      06/14/20 1418   06/13/20 0846  For home use only DME oxygen  Once       Question Answer Comment  Length of Need Lifetime   Oxygen delivery system Gas      06/13/20 0845          Follow-up Information    Tommie Raymond, NP. Go on 07/20/2020.   Specialty: Cardiology Why: @ 2:45pm for hospital follow up with Dr. Kyla Balzarine PA Contact information: [place] [address] [phone]        [place]. Go on 06/28/2020.   Specialty: Cardiology Why: @1 :30pm for afib. call for parking code Contact information: [address] [id number] [place] [address] [facility] Follow up.   Why: Oxygen- to be delivered to the room prior to transition home.  Contact information: [address] [place] [address] [phone]              Allergies  Allergen Reactions  . Penicillin G Rash    Consultations:  Cardiology, pulmonology   Procedures/Studies: DG Chest 1 View  Result Date: 06/13/2020 CLINICAL DATA:  Interstitial lung disease. Shortness of breath. Wheezing. EXAM: CHEST  1 VIEW COMPARISON:  Chest x-ray 06/09/2020, 11/18/2019. Chest CT 04/04/2020. FINDINGS: Mediastinum and hilar structures normal. Stable mild cardiomegaly. No pulmonary venous congestion. Persistent right upper lobe and left base prominent infiltrates. New mild infiltrate right lung base. Underlying chronic interstitial lung disease. Tiny bilateral pleural effusions. Progressive right apical pleural thickening noted. A developing right apical pleural fluid collection cannot be excluded. No pneumothorax. Previously identified left upper lobe nodule noted on chest CT of 04/04/2020 not identified by chest x-ray. Reference is made to prior CT report 04/04/2020. Degenerative change thoracic spine. IMPRESSION: 1. Persistent right upper lobe and left base prominent infiltrates. New mild infiltrate right lung base. Tiny bilateral pleural effusions. Progressive right apical pleural thickening. A developing right apical pleural fluid collection cannot be excluded. 2. Underlying chronic interstitial lung disease. 3. Previously identified left upper lobe nodule noted on chest CT of 04/04/2020 not identified by chest x-ray. Reference is made to prior CT report of 04/04/2020. Electronically Signed   By: Marcello Moores  Register   On: 06/13/2020 06:12   US RENAL  Result Date: 06/10/2020 CLINICAL DATA:  AK I EXAM: RENAL / URINARY TRACT ULTRASOUND COMPLETE COMPARISON:  None. FINDINGS: Right Kidney: Renal measurements: 9.6 x 5.5 x 3.8 cm =  volume: 105 mL. No definite hydronephrosis. There is an exophytic cyst measuring 1.7 cm. Left Kidney: Renal measurements: 11.0 x 5.8 x 5.4 cm = volume: 180 mL. No definite hydronephrosis or renal mass. Bladder: Appears normal for degree of  bladder distention. Other: None. IMPRESSION: Exam limited by shadowing bowel gas and patient body habitus. Within these limitations no acute finding in the bilateral kidneys. Right renal cyst. Electronically Signed   By: Audie Pinto M.D.   On: 06/10/2020 18:14   DG Chest Port 1 View  Result Date: 06/09/2020 CLINICAL DATA:  Shortness of breath EXAM: PORTABLE CHEST 1 VIEW COMPARISON:  Chest x-ray 11/18/2019, CT chest 04/04/2020 FINDINGS: The heart size and mediastinal contours are unchanged. Aortic arch calcification. Interval development of hazy airspace opacity overlying previously noted coarsened interstitial markings/scarring within the right upper lobe, right middle lobe, lingula, and left lower lobe. Bilateral lower lobe calcified granulomas again noted. No pulmonary edema. Likely trace left pleural effusion. No pneumothorax. No acute osseous abnormality. IMPRESSION: 1. Interval development of multifocal infection/inflammation. Followup PA and lateral chest X-ray is recommended in 3-4 weeks following therapy to ensure resolution and exclude underlying malignancy. 2. Likely trace left pleural effusion. Electronically Signed   By: Iven Finn M.D.   On: 06/09/2020 16:32   ECHOCARDIOGRAM COMPLETE  Result Date: 06/10/2020    ECHOCARDIOGRAM REPORT   Patient Name:   Justin Justin Mccarty Kindler Date of Exam: 06/10/2020 Medical Rec #:  [id number]     Height:       67.0 in Accession #:    [id number]    Weight:       183.4 lb Date of Birth:  [birth year]      BSA:          1.949 m Patient Age:    84 years      BP:           118/61 mmHg Patient Gender: M             HR:           78 bpm. Exam Location:  Inpatient Procedure: 2D Echo, Cardiac Doppler and Color Doppler Indications:     Atrial fibrillation  History:        Patient has no prior history of Echocardiogram examinations.                 Arrythmias:Atrial Fibrillation.  Sonographer:    Clayton Lefort RDCS (AE) Referring Phys: [id number] [facility] Julious Oka  Sonographer Comments: Image acquisition challenging due to respiratory motion. IMPRESSIONS  1. Left ventricular ejection fraction, by estimation, is 45 to 50%. The left ventricle has mildly decreased function. The left ventricle demonstrates global hypokinesis. There is moderate concentric left ventricular hypertrophy. Left ventricular diastolic function could not be evaluated. There is the interventricular septum is flattened in systole and diastole, consistent with right ventricular pressure and volume overload.  2. Right ventricular systolic function is moderately reduced. The right ventricular size is moderately enlarged. There is moderately elevated pulmonary artery systolic pressure. The estimated right ventricular systolic pressure is 32.6 mmHg.  3. Left atrial size was mildly dilated.  4. Right atrial size was moderately dilated.  5. The mitral valve is normal in structure. No evidence of mitral valve regurgitation. No evidence of mitral stenosis.  6. The aortic valve is normal in structure. Aortic valve regurgitation is mild. Mild to moderate aortic valve sclerosis/calcification is present, without any evidence of aortic stenosis.  7. The inferior vena cava is dilated in size with <50% respiratory variability, suggesting right atrial pressure of 15 mmHg. FINDINGS  Left Ventricle: Left ventricular ejection fraction, by estimation, is 45 to 50%. The left ventricle has mildly decreased function. The left ventricle demonstrates global hypokinesis. The left  ventricular internal cavity size was normal in size. There is  moderate concentric left ventricular hypertrophy. The interventricular septum is flattened in systole and diastole, consistent with right ventricular pressure and volume  overload. Left ventricular diastolic function could not be evaluated due to atrial fibrillation. Left ventricular diastolic function could not be evaluated. Right Ventricle: The right ventricular size is moderately enlarged. No increase in right ventricular wall thickness. Right ventricular systolic function is moderately reduced. There is moderately elevated pulmonary artery systolic pressure. The tricuspid  regurgitant velocity is 2.86 m/s, and with an assumed right atrial pressure of 15 mmHg, the estimated right ventricular systolic pressure is 37.8 mmHg. Left Atrium: Left atrial size was mildly dilated. Right Atrium: Right atrial size was moderately dilated. Pericardium: There is no evidence of pericardial effusion. Mitral Valve: The mitral valve is normal in structure. There is mild thickening of the mitral valve leaflet(s). There is mild calcification of the mitral valve leaflet(s). Mild mitral annular calcification. No evidence of mitral valve regurgitation. No evidence of mitral valve stenosis. Tricuspid Valve: The tricuspid valve is normal in structure. Tricuspid valve regurgitation is mild . No evidence of tricuspid stenosis. Aortic Valve: The aortic valve is normal in structure. Aortic valve regurgitation is mild. Aortic regurgitation PHT measures 988 msec. Mild to moderate aortic valve sclerosis/calcification is present, without any evidence of aortic stenosis. Aortic valve  mean gradient measures 2.0 mmHg. Aortic valve peak gradient measures 3.6 mmHg. Aortic valve area, by VTI measures 2.88 cm. Pulmonic Valve: The pulmonic valve was normal in structure. Pulmonic valve regurgitation is not visualized. No evidence of pulmonic stenosis. Aorta: The aortic root is normal in size and structure. Venous: The inferior vena cava is dilated in size with less than 50% respiratory variability, suggesting right atrial pressure of 15 mmHg. IAS/Shunts: No atrial level shunt detected by color flow Doppler.  LEFT  VENTRICLE PLAX 2D LVIDd:         5.20 cm LVIDs:         4.00 cm LV PW:         1.30 cm LV IVS:        1.40 cm LVOT diam:     2.00 cm LV SV:         46 LV SV Index:   24 LVOT Area:     3.14 cm  RIGHT VENTRICLE            IVC RV Basal diam:  3.30 cm    IVC diam: 2.50 cm RV S prime:     8.79 cm/s TAPSE (M-mode): 1.2 cm LEFT ATRIUM             Index       RIGHT ATRIUM           Index LA diam:        4.10 cm 2.10 cm/m  RA Area:     17.80 cm LA Vol (A2C):   46.2 ml 23.70 ml/m RA Volume:   43.40 ml  22.27 ml/m LA Vol (A4C):   61.1 ml 31.35 ml/m LA Biplane Vol: 58.8 ml 30.17 ml/m  AORTIC VALVE AV Area (Vmax):    2.62 cm AV Area (Vmean):   2.49 cm AV Area (VTI):     2.88 cm AV Vmax:           95.18 cm/s AV Vmean:          70.025 cm/s AV VTI:  0.161 m AV Peak Grad:      3.6 mmHg AV Mean Grad:      2.0 mmHg LVOT Vmax:         79.48 cm/s LVOT Vmean:        55.500 cm/s LVOT VTI:          0.148 m LVOT/AV VTI ratio: 0.92 AI PHT:            988 msec  AORTA Ao Root diam: 3.50 cm Ao Asc diam:  3.30 cm TRICUSPID VALVE TR Peak grad:   32.7 mmHg TR Vmax:        286.00 cm/s  SHUNTS Systemic VTI:  0.15 m Systemic Diam: 2.00 cm Ena Dawley MD Electronically signed by Ena Dawley MD Signature Date/Time: 06/10/2020/1:51:20 PM    Final    VAS [place] LOWER EXTREMITY VENOUS (DVT)  Result Date: 06/12/2020  Lower Venous DVT Study Indications: Edema.  Risk Factors: Atrial fibrillation. Comparison Study: No prior study on file Performing Technologist: Sharion Dove RVS  Examination Guidelines: A complete evaluation includes B-mode imaging, spectral Doppler, color Doppler, and power Doppler as needed of all accessible portions of each vessel. Bilateral testing is considered an integral part of a complete examination. Limited examinations for reoccurring indications may be performed as noted. The reflux portion of the exam is performed with the patient in reverse Trendelenburg.   +---------+---------------+---------+-----------+----------+--------------+ RIGHT    CompressibilityPhasicitySpontaneityPropertiesThrombus Aging +---------+---------------+---------+-----------+----------+--------------+ CFV      Full                                         pulsatile      +---------+---------------+---------+-----------+----------+--------------+ SFJ      Full                                                        +---------+---------------+---------+-----------+----------+--------------+ FV Prox  Full                                                        +---------+---------------+---------+-----------+----------+--------------+ FV Mid   Full                                                        +---------+---------------+---------+-----------+----------+--------------+ FV DistalFull                                                        +---------+---------------+---------+-----------+----------+--------------+ PFV      Full                                                        +---------+---------------+---------+-----------+----------+--------------+  POP      Full                                         pulsatile      +---------+---------------+---------+-----------+----------+--------------+ PTV      Full                                                        +---------+---------------+---------+-----------+----------+--------------+ PERO     Full                                                        +---------+---------------+---------+-----------+----------+--------------+   +---------+---------------+---------+-----------+----------+--------------+ LEFT     CompressibilityPhasicitySpontaneityPropertiesThrombus Aging +---------+---------------+---------+-----------+----------+--------------+ CFV      Full                                         pulsatile       +---------+---------------+---------+-----------+----------+--------------+ SFJ      Full                                                        +---------+---------------+---------+-----------+----------+--------------+ FV Prox  Full                                                        +---------+---------------+---------+-----------+----------+--------------+ FV Mid   Full                                                        +---------+---------------+---------+-----------+----------+--------------+ FV DistalFull                                                        +---------+---------------+---------+-----------+----------+--------------+ PFV      Full                                                        +---------+---------------+---------+-----------+----------+--------------+ POP      Full                                         pulsatile      +---------+---------------+---------+-----------+----------+--------------+  PTV      Full                                                        +---------+---------------+---------+-----------+----------+--------------+ PERO     Full                                                        +---------+---------------+---------+-----------+----------+--------------+     Summary: RIGHT: - There is no evidence of deep vein thrombosis in the lower extremity.  LEFT: - There is no evidence of deep vein thrombosis in the lower extremity.  *See table(s) above for measurements and observations. Electronically signed by Ruta Hinds MD on 06/12/2020 at 7:40:59 PM.    Final        Subjective:   Discharge Exam: Vitals:   06/14/20 0748 06/14/20 1100  BP:  122/72  Pulse:  85  Resp:  18  Temp:  98.2 F (36.8 C)  SpO2: 95% 98%   Vitals:   06/14/20 0719 06/14/20 0743 06/14/20 0748 06/14/20 1100  BP:  108/71  122/72  Pulse: 86 74  85  Resp:  18  18  Temp:  98.4 F (36.9 C)  98.2 F (36.8 C)   TempSrc:  Oral  Oral  SpO2: (!) 87% 92% 95% 98%  Weight:      Height:        General: Pt is alert, awake, not in acute distress Cardiovascular: RRR, S1/S2 +, no rubs, no gallops Respiratory: CTA bilaterally, no wheezing, no rhonchi Abdominal: Soft, NT, ND, bowel sounds + Extremities: no edema, no cyanosis    The results of significant diagnostics from this hospitalization (including imaging, microbiology, ancillary and laboratory) are listed below for reference.     Microbiology: Recent Results (from the past 240 hour(s))  Resp Panel by RT-PCR (Flu A&B, Covid) Nasopharyngeal Swab     Status: None   Collection Time: 06/09/20  9:25 PM   Specimen: Nasopharyngeal Swab; Nasopharyngeal(NP) swabs in vial transport medium  Result Value Ref Range Status   SARS Coronavirus 2 by RT PCR NEGATIVE NEGATIVE Final    Comment: (NOTE) SARS-CoV-2 target nucleic acids are NOT DETECTED.  The SARS-CoV-2 RNA is generally detectable in upper respiratory specimens during the acute phase of infection. The lowest concentration of SARS-CoV-2 viral copies this assay can detect is 138 copies/mL. A negative result does not preclude SARS-Cov-2 infection and should not be used as the sole basis for treatment or other patient management decisions. A negative result may occur with  improper specimen collection/handling, submission of specimen other than nasopharyngeal swab, presence of viral mutation(s) within the areas targeted by this assay, and inadequate number of viral copies(<138 copies/mL). A negative result must be combined with clinical observations, patient history, and epidemiological information. The expected result is Negative.  Fact Sheet for Patients:  EntrepreneurPulse.com.au  Fact Sheet for Healthcare Providers:  IncredibleEmployment.be  This test is no t yet approved or cleared by the [place] FDA and  has been authorized for detection and/or  diagnosis of SARS-CoV-2 by FDA under an Emergency Use Authorization (EUA). This EUA will remain  in effect (meaning this test can  be used) for the duration of the COVID-19 declaration under Section 564(b)(1) of the Act, 21 U.S.C.section 360bbb-3(b)(1), unless the authorization is terminated  or revoked sooner.       Influenza A by PCR NEGATIVE NEGATIVE Final   Influenza B by PCR NEGATIVE NEGATIVE Final    Comment: (NOTE) The Xpert Xpress SARS-CoV-2/FLU/RSV plus assay is intended as an aid in the diagnosis of influenza from Nasopharyngeal swab specimens and should not be used as a sole basis for treatment. Nasal washings and aspirates are unacceptable for Xpert Xpress SARS-CoV-2/FLU/RSV testing.  Fact Sheet for Patients: EntrepreneurPulse.com.au  Fact Sheet for Healthcare Providers: IncredibleEmployment.be  This test is not yet approved or cleared by the [place] FDA and has been authorized for detection and/or diagnosis of SARS-CoV-2 by FDA under an Emergency Use Authorization (EUA). This EUA will remain in effect (meaning this test can be used) for the duration of the COVID-19 declaration under Section 564(b)(1) of the Act, 21 U.S.C. section 360bbb-3(b)(1), unless the authorization is terminated or revoked.  Performed at [facility], [place] [address], [place], [place] [address]   Culture, blood (routine x 2) Call MD if unable to obtain prior to antibiotics being given     Status: None (Preliminary result)   Collection Time: 06/10/20 12:34 AM   Specimen: BLOOD RIGHT HAND  Result Value Ref Range Status   Specimen Description BLOOD RIGHT HAND  Final   Special Requests AEROBIC BOTTLE ONLY Blood Culture adequate volume  Final   Culture   Final    NO GROWTH 4 DAYS Performed at [facility], [place] [address] [place], [place] [address]    Report Status PENDING  Incomplete  Culture, blood (routine x 2) Call MD if unable to obtain  prior to antibiotics being given     Status: None (Preliminary result)   Collection Time: 06/10/20 12:54 AM   Specimen: BLOOD LEFT HAND  Result Value Ref Range Status   Specimen Description BLOOD LEFT HAND  Final   Special Requests   Final    BOTTLES DRAWN AEROBIC AND ANAEROBIC Blood Culture results may not be optimal due to an inadequate volume of blood received in culture bottles   Culture   Final    NO GROWTH 4 DAYS Performed at [facility], [place] [address] [place], [place] [address]    Report Status PENDING  Incomplete  Respiratory Panel by PCR     Status: None   Collection Time: 06/10/20  1:41 PM   Specimen: Nasopharyngeal Swab; Respiratory  Result Value Ref Range Status   Adenovirus NOT DETECTED NOT DETECTED Final   Coronavirus 229E NOT DETECTED NOT DETECTED Final    Comment: (NOTE) The Coronavirus on the Respiratory Panel, DOES NOT test for the novel  Coronavirus (2019 nCoV)    Coronavirus HKU1 NOT DETECTED NOT DETECTED Final   Coronavirus NL63 NOT DETECTED NOT DETECTED Final   Coronavirus OC43 NOT DETECTED NOT DETECTED Final   Metapneumovirus NOT DETECTED NOT DETECTED Final   Rhinovirus / Enterovirus NOT DETECTED NOT DETECTED Final   Influenza A NOT DETECTED NOT DETECTED Final   Influenza B NOT DETECTED NOT DETECTED Final   Parainfluenza Virus 1 NOT DETECTED NOT DETECTED Final   Parainfluenza Virus 2 NOT DETECTED NOT DETECTED Final   Parainfluenza Virus 3 NOT DETECTED NOT DETECTED Final   Parainfluenza Virus 4 NOT DETECTED NOT DETECTED Final   Respiratory Syncytial Virus NOT DETECTED NOT DETECTED Final   Bordetella pertussis  NOT DETECTED NOT DETECTED Final   Bordetella Parapertussis NOT DETECTED NOT DETECTED Final   Chlamydophila pneumoniae NOT DETECTED NOT DETECTED Final   Mycoplasma pneumoniae NOT DETECTED NOT DETECTED Final    Comment: Performed at [facility], [place] [address] [place], [place] [address]  MRSA PCR Screening     Status: None   Collection  Time: 06/11/20  9:32 AM   Specimen: Nasal Mucosa; Nasopharyngeal  Result Value Ref Range Status   MRSA by PCR NEGATIVE NEGATIVE Final    Comment:        The GeneXpert MRSA Assay (FDA approved for NASAL specimens only), is one component of a comprehensive MRSA colonization surveillance program. It is not intended to diagnose MRSA infection nor to guide or monitor treatment for MRSA infections. Performed at [facility], [place] [address], [place], [place] [address]      Labs: BNP (last 3 results) Recent Labs    06/09/20 1634  BNP 103.1*   Basic Metabolic Panel: Recent Labs  Lab 06/09/20 1633 06/10/20 0034 06/11/20 0556 06/11/20 1428 06/12/20 0753 06/13/20 0313 06/14/20 0216  NA 132*   < > 133* 134* 133* 135 139  K 4.9   < > 4.8 4.7 4.9 5.0 4.8  CL 99   < > 98 97* 97* 100 97*  CO2 20*   < > 25 23 24 25  33*  GLUCOSE 143*   < > 140* 192* 130* 122* 136*  BUN 54*   < > 57* 56* 56* 57* 51*  CREATININE 2.11*   < > 2.02* 2.03* 1.98* 2.00* 1.82*  CALCIUM 8.6*   < > 8.3* 8.4* 8.2* 8.0* 8.3*  MG 2.3  --  2.2  --   --  2.2 2.1   < > = values in this interval not displayed.   Liver Function Tests: Recent Labs  Lab 06/09/20 1633 06/10/20 0034 06/11/20 0556 06/14/20 0216  AST 663* 404* 139* 46*  ALT 551* 495* 336* 175*  ALKPHOS 135* 122 106 86  BILITOT 0.9 0.7 0.6 0.7  PROT 6.5 6.6 6.1* 5.8*  ALBUMIN 2.5* 2.5* 2.4* 2.4*   No results for input(s): LIPASE, AMYLASE in the last 168 hours. No results for input(s): AMMONIA in the last 168 hours. CBC: Recent Labs  Lab 06/09/20 1633 06/10/20 0034 06/11/20 0556 06/12/20 0628 06/13/20 0313 06/14/20 0216  WBC 10.1 9.9 14.4* 14.6* 11.6* 12.0*  NEUTROABS 8.4* 8.9*  --   --   --   --   HGB 10.7* 10.6* 10.1* 10.8* 10.1* 11.1*  HCT 32.7* 31.4* 31.7* 33.9* 30.2* 33.5*  MCV 94.8 92.9 95.2 95.5 93.8 93.6  PLT 301 290 285 283 213 249   Cardiac Enzymes: Recent Labs  Lab 06/10/20 1503  CKTOTAL 42*   BNP: Invalid  input(s): POCBNP CBG: No results for input(s): GLUCAP in the last 168 hours. D-Dimer No results for input(s): DDIMER in the last 72 hours. Hgb A1c No results for input(s): HGBA1C in the last 72 hours. Lipid Profile No results for input(s): CHOL, HDL, LDLCALC, TRIG, CHOLHDL, LDLDIRECT in the last 72 hours. Thyroid function studies No results for input(s): TSH, T4TOTAL, T3FREE, THYROIDAB in the last 72 hours.  Invalid input(s): FREET3 Anemia work up No results for input(s): VITAMINB12, FOLATE, FERRITIN, TIBC, IRON, RETICCTPCT in the last 72 hours. Urinalysis    Component Value Date/Time   COLORURINE YELLOW 06/09/2020 2331   APPEARANCEUR CLEAR 06/09/2020 2331   LABSPEC 1.018 06/09/2020 2331   PHURINE  5.0 06/09/2020 2331   GLUCOSEU NEGATIVE 06/09/2020 2331   HGBUR NEGATIVE 06/09/2020 2331   BILIRUBINUR NEGATIVE 06/09/2020 2331   KETONESUR NEGATIVE 06/09/2020 2331   PROTEINUR NEGATIVE 06/09/2020 2331   UROBILINOGEN 0.2 09/13/2008 0820   NITRITE NEGATIVE 06/09/2020 2331   LEUKOCYTESUR NEGATIVE 06/09/2020 2331   Sepsis Labs Invalid input(s): PROCALCITONIN,  WBC,  LACTICIDVEN Microbiology Recent Results (from the past 240 hour(s))  Resp Panel by RT-PCR (Flu A&B, Covid) Nasopharyngeal Swab     Status: None   Collection Time: 06/09/20  9:25 PM   Specimen: Nasopharyngeal Swab; Nasopharyngeal(NP) swabs in vial transport medium  Result Value Ref Range Status   SARS Coronavirus 2 by RT PCR NEGATIVE NEGATIVE Final    Comment: (NOTE) SARS-CoV-2 target nucleic acids are NOT DETECTED.  The SARS-CoV-2 RNA is generally detectable in upper respiratory specimens during the acute phase of infection. The lowest concentration of SARS-CoV-2 viral copies this assay can detect is 138 copies/mL. A negative result does not preclude SARS-Cov-2 infection and should not be used as the sole basis for treatment or other patient management decisions. A negative result may occur with  improper specimen  collection/handling, submission of specimen other than nasopharyngeal swab, presence of viral mutation(s) within the areas targeted by this assay, and inadequate number of viral copies(<138 copies/mL). A negative result must be combined with clinical observations, patient history, and epidemiological information. The expected result is Negative.  Fact Sheet for Patients:  EntrepreneurPulse.com.au  Fact Sheet for Healthcare Providers:  IncredibleEmployment.be  This test is no t yet approved or cleared by the [place] FDA and  has been authorized for detection and/or diagnosis of SARS-CoV-2 by FDA under an Emergency Use Authorization (EUA). This EUA will remain  in effect (meaning this test can be used) for the duration of the COVID-19 declaration under Section 564(b)(1) of the Act, 21 U.S.C.section 360bbb-3(b)(1), unless the authorization is terminated  or revoked sooner.       Influenza A by PCR NEGATIVE NEGATIVE Final   Influenza B by PCR NEGATIVE NEGATIVE Final    Comment: (NOTE) The Xpert Xpress SARS-CoV-2/FLU/RSV plus assay is intended as an aid in the diagnosis of influenza from Nasopharyngeal swab specimens and should not be used as a sole basis for treatment. Nasal washings and aspirates are unacceptable for Xpert Xpress SARS-CoV-2/FLU/RSV testing.  Fact Sheet for Patients: EntrepreneurPulse.com.au  Fact Sheet for Healthcare Providers: IncredibleEmployment.be  This test is not yet approved or cleared by the [place] FDA and has been authorized for detection and/or diagnosis of SARS-CoV-2 by FDA under an Emergency Use Authorization (EUA). This EUA will remain in effect (meaning this test can be used) for the duration of the COVID-19 declaration under Section 564(b)(1) of the Act, 21 U.S.C. section 360bbb-3(b)(1), unless the authorization is terminated or revoked.  Performed at [facility], [place] [address], [place], [place] [address]   Culture, blood (routine x 2) Call MD if unable to obtain prior to antibiotics being given     Status: None (Preliminary result)   Collection Time: 06/10/20 12:34 AM   Specimen: BLOOD RIGHT HAND  Result Value Ref Range Status   Specimen Description BLOOD RIGHT HAND  Final   Special Requests AEROBIC BOTTLE ONLY Blood Culture adequate volume  Final   Culture   Final    NO GROWTH 4 DAYS Performed at [facility], [place] [address], [place], [place] [address]    Report Status PENDING  Incomplete  Culture, blood (  routine x 2) Call MD if unable to obtain prior to antibiotics being given     Status: None (Preliminary result)   Collection Time: 06/10/20 12:54 AM   Specimen: BLOOD LEFT HAND  Result Value Ref Range Status   Specimen Description BLOOD LEFT HAND  Final   Special Requests   Final    BOTTLES DRAWN AEROBIC AND ANAEROBIC Blood Culture results may not be optimal due to an inadequate volume of blood received in culture bottles   Culture   Final    NO GROWTH 4 DAYS Performed at [facility], [place] [address] [place], [place] [address]    Report Status PENDING  Incomplete  Respiratory Panel by PCR     Status: None   Collection Time: 06/10/20  1:41 PM   Specimen: Nasopharyngeal Swab; Respiratory  Result Value Ref Range Status   Adenovirus NOT DETECTED NOT DETECTED Final   Coronavirus 229E NOT DETECTED NOT DETECTED Final    Comment: (NOTE) The Coronavirus on the Respiratory Panel, DOES NOT test for the novel  Coronavirus (2019 nCoV)    Coronavirus HKU1 NOT DETECTED NOT DETECTED Final   Coronavirus NL63 NOT DETECTED NOT DETECTED Final   Coronavirus OC43 NOT DETECTED NOT DETECTED Final   Metapneumovirus NOT DETECTED NOT DETECTED Final   Rhinovirus / Enterovirus NOT DETECTED NOT DETECTED Final   Influenza A NOT DETECTED NOT DETECTED Final   Influenza B NOT DETECTED NOT DETECTED Final   Parainfluenza Virus 1 NOT  DETECTED NOT DETECTED Final   Parainfluenza Virus 2 NOT DETECTED NOT DETECTED Final   Parainfluenza Virus 3 NOT DETECTED NOT DETECTED Final   Parainfluenza Virus 4 NOT DETECTED NOT DETECTED Final   Respiratory Syncytial Virus NOT DETECTED NOT DETECTED Final   Bordetella pertussis NOT DETECTED NOT DETECTED Final   Bordetella Parapertussis NOT DETECTED NOT DETECTED Final   Chlamydophila pneumoniae NOT DETECTED NOT DETECTED Final   Mycoplasma pneumoniae NOT DETECTED NOT DETECTED Final    Comment: Performed at [facility], [address] [place], [place] [address]  MRSA PCR Screening     Status: None   Collection Time: 06/11/20  9:32 AM   Specimen: Nasal Mucosa; Nasopharyngeal  Result Value Ref Range Status   MRSA by PCR NEGATIVE NEGATIVE Final    Comment:        The GeneXpert MRSA Assay (FDA approved for NASAL specimens only), is one component of a comprehensive MRSA colonization surveillance program. It is not intended to diagnose MRSA infection nor to guide or monitor treatment for MRSA infections. Performed at [facility], [place] [address] [place], [place] [address]      Time coordinating discharge: 40 minutes  SIGNED:   Elvin So, MD  [facility] 06/14/2020, 2:40 PM

## 2020-06-15 ENCOUNTER — Telehealth: Payer: Self-pay | Admitting: Pulmonary Disease

## 2020-06-15 NOTE — Telephone Encounter (Signed)
Copied from Dr. Michaelle Copas progress note 06/14/20:   Plan:  - Would send him home on PO diuretics and have BMP checked early next week, defer dosing to cardiology - Continue qHS seroquel - 2 week steroid taper as ordered - Dilt/AC per cardiology - Wean O2 to maintain sats > 90%, home O2 eval as I suspect he will need going home - Continue breo - PT consult, IS/flutter - AC and rate control per cardiology - Home O2 eval done, would have him use 4L at night and with activity - Okay for home from my standpoint, has f/u with Dr. Francine Graven on 07/04/20 at 12:00 - We are available PRN  Myrla Halsted MD PCCM   ---------------------  Attempted to call pt since only his wife is on DPR.  ATC preferred number [phone], line rang to fast busy signal.  Will call back.

## 2020-06-16 LAB — CULTURE, BLOOD (ROUTINE X 2)
Culture: NO GROWTH
Culture: NO GROWTH
Special Requests: ADEQUATE

## 2020-06-16 NOTE — Telephone Encounter (Signed)
Spoke with patient's daughter in law. She verbalized understanding. She did state that his O2 levels had come back up to 89%-92% while on O2. I explained to her that he still needed to go to the hospital.

## 2020-06-16 NOTE — Telephone Encounter (Signed)
Patient saw Dr. Francine Graven in hospital , discussed with Dr. Everardo All and patient is scheduled to see Dr. Francine Graven for follow up .    Nothing further needed at this time.

## 2020-06-16 NOTE — Telephone Encounter (Signed)
Spoke with the pt's spouse  She states that they have been keeping o2 at 4lpm and his sats are only 78% at rest  He gets very SOB with just minimal exertion and also wheezing  She states that he has to sleep propped up in a chair due to SOB  She states that she is concerned bc home health was supposed to have been ordered by hospital, but they have not been contacted  I do not see a referral  I advised pt may need to present to ED with these low sats  She states unsure of they are correct b/c his hands are large and pulse ox not fitting well  Please advise thanks!

## 2020-06-16 NOTE — Telephone Encounter (Signed)
Pt daughter states they have been given conflicting information about O2 setting. States he was told he would be on 4L of O2 but [employer] states it should be 2L when they dropped it off. Also states that they changed it to 4L and his O2 is only at 78%. States to call wife Luisantonio Adinolfi back at [phone]

## 2020-06-16 NOTE — Telephone Encounter (Signed)
With saturations in the 70s, he should go to the hospital especially if oxygen is not bringing it up  This is not one of those things we can fix over the phone

## 2020-06-28 ENCOUNTER — Other Ambulatory Visit: Payer: Self-pay

## 2020-06-28 ENCOUNTER — Ambulatory Visit (HOSPITAL_COMMUNITY)
Admit: 2020-06-28 | Discharge: 2020-06-28 | Disposition: A | Discharge status: Home / Self Care | Payer: Medicare Other | Source: Ambulatory Visit | Attending: Nurse Practitioner | Admitting: Nurse Practitioner

## 2020-06-28 VITALS — BP 100/60 | HR 93 | Ht 67.0 in | Wt 156.8 lb

## 2020-06-28 DIAGNOSIS — R5383 Other fatigue: Secondary | ICD-10-CM | POA: Insufficient documentation

## 2020-06-28 DIAGNOSIS — J9601 Acute respiratory failure with hypoxia: Secondary | ICD-10-CM | POA: Insufficient documentation

## 2020-06-28 DIAGNOSIS — D6869 Other thrombophilia: Secondary | ICD-10-CM | POA: Diagnosis not present

## 2020-06-28 DIAGNOSIS — Z880 Allergy status to penicillin: Secondary | ICD-10-CM | POA: Insufficient documentation

## 2020-06-28 DIAGNOSIS — I5043 Acute on chronic combined systolic (congestive) and diastolic (congestive) heart failure: Secondary | ICD-10-CM | POA: Diagnosis not present

## 2020-06-28 DIAGNOSIS — Z7901 Long term (current) use of anticoagulants: Secondary | ICD-10-CM | POA: Diagnosis not present

## 2020-06-28 DIAGNOSIS — Z79899 Other long term (current) drug therapy: Secondary | ICD-10-CM | POA: Diagnosis not present

## 2020-06-28 DIAGNOSIS — I4891 Unspecified atrial fibrillation: Secondary | ICD-10-CM | POA: Diagnosis not present

## 2020-06-28 NOTE — Progress Notes (Signed)
Primary Care Physician: Adrienne Mocha, PA Referring Physician: [facility] 2 f/u    Justin Mccarty is a 84 y.o. male with a h/o  pulmonary fibrosis, GERD, hyponatremia, anemia sent to ED by his pulmonology for hypoxia with 78% on room air. He was therefore  admitted with new onset A. fib with RVR and worsening hypoxemia. His chest x-ray indicated right lung pneumonia. He had a prior h/o of afib. He was d/c on eliquis 2.5 mg bid  and cardizem. CHA2DS2VASc score is at least 3.   Today, EKG shows  rate controlled afib. He continues to feel fatigued and  short of breath with intermittent wheezing. 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 . She feels overwhelmed with his care. PCP is aware and working on [facility] nurse.   Today, he denies symptoms of palpitations, chest pain, shortness of breath, orthopnea, PND, lower extremity edema, dizziness, presyncope, syncope, or neurologic sequela. The patient is tolerating medications without difficulties and is otherwise without complaint today.   Past Medical History:  Diagnosis Date  . Acid reflux disease   . Acute blood loss anemia    Postoperative  . Cellulitis    History of Cellulitis  . History of sepsis   . Hypercholesterolemia    Mild  . Hyponatremia       . 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 Transfusion history    Status post transfusion without sequelae   Past Surgical History:  Procedure Laterality Date  . HERNIA REPAIR     Abdominal  . KNEE SURGERY    . TRANSURETHRAL RESECTION OF PROSTATE      Current Outpatient Medications  Medication Sig Dispense Refill  . albuterol (PROVENTIL) (2.5 MG/3ML) 0.083% nebulizer solution Take 3 mLs (2.5 mg total) by nebulization every 6 (six) hours as needed for wheezing or shortness of breath. 75 mL 1  . apixaban (ELIQUIS) 2.5 MG TABS tablet Take 1 tablet (2.5 mg total) by mouth 2 (two) times daily. 60 tablet 0  . Budeson-Glycopyrrol-Formoterol (BREZTRI AEROSPHERE) 160-9-4.8  MCG/ACT AERO Inhale 2 puffs into the lungs in the morning and at bedtime. 10.7 g 1  . busPIRone (BUSPAR) 5 MG tablet Take 1 tablet (5 mg total) by mouth 3 (three) times daily. 90 tablet 1  . Cyanocobalamin (VITAMIN B 12 PO) Take 2 capsules by mouth in the morning and at bedtime.     Marland Kitchen diltiazem (CARDIZEM CD) 180 MG 24 hr capsule Take 1 capsule (180 mg total) by mouth daily. 30 capsule 0  . furosemide (LASIX) 40 MG tablet Take 1 tablet (40 mg total) by mouth daily. 30 tablet 1  . glucosamine-chondroitin 500-400 MG tablet Take 1 tablet by mouth in the morning and at bedtime.    Marland Kitchen HYDROcodone-homatropine (HYCODAN) 5-1.5 MG/5ML syrup Take 5 mLs by mouth every 6 (six) hours as needed for cough. 240 mL 0  . Multiple Vitamins-Minerals (ICAPS AREDS 2 PO) Take 1 capsule by mouth in the morning and at bedtime.    . Omega-3 Fatty Acids (FISH OIL PO) Take 1 capsule by mouth 2 (two) times daily.     . pantoprazole (PROTONIX) 40 MG tablet Take 1 tablet (40 mg total) by mouth daily. 30 tablet 0  . predniSONE (DELTASONE) 20 MG tablet Take 2 tablets (40 mg total) by mouth daily with breakfast for 7 days, THEN 1 tablet (20 mg total) daily with breakfast for 7 days. 21 tablet 0  . VITAMIN  D PO Take 2 tablets by mouth daily.     No current facility-administered medications for this encounter.    Allergies  Allergen Reactions  . Penicillin G Rash    Social History   Socioeconomic History  . Marital status: Married    Spouse name: Not on file  . Number of children: Not on file  . Years of education: Not on file  . Highest education level: Not on file  Occupational History  . Not on file  Tobacco Use  . Smoking status: Never Smoker  . Smokeless tobacco: Never Used  Substance and Sexual Activity  . Alcohol use: No  . Drug use: Never  . Sexual activity: Not Currently  Other Topics Concern  . Not on file  Social History Narrative  . Not on file   Social Determinants of Health   Financial Resource  Strain: Not on file  Food Insecurity: Not on file  Transportation Needs: Not on file  Physical Activity: Not on file  Stress: Not on file  Social Connections: Not on file  Intimate Partner Violence: Not on file    Family History  Problem Relation Age of Onset  . Bone cancer Mother   . Colon cancer Sister     ROS- All systems are reviewed and negative except as per the HPI above  Physical Exam: There were no vitals filed for this visit. Wt Readings from Last 3 Encounters:  06/14/20 82.4 kg  06/06/20 83.8 kg  04/20/20 82.1 kg    Labs: Lab Results  Component Value Date   NA 139 06/14/2020   K 4.8 06/14/2020   CL 97 (L) 06/14/2020   CO2 33 (H) 06/14/2020   GLUCOSE 136 (H) 06/14/2020   BUN 51 (H) 06/14/2020   CREATININE 1.82 (H) 06/14/2020   CALCIUM 8.3 (L) 06/14/2020   MG 2.1 06/14/2020   Lab Results  Component Value Date   INR 1.8 (H) 06/09/2020   No results found for: CHOL, HDL, LDLCALC, TRIG   GEN- The patient is  chronically ill  appearing, alert and oriented x 3 today.   Head- normocephalic, atraumatic Eyes-  Sclera clear, conjunctiva pink Ears- hearing intact Oropharynx- clear Neck- supple, no JVP Lymph- no cervical lymphadenopathy Lungs- wheezing heard bilaterally  Heart- irregular rate and rhythm, no murmurs, rubs or gallops, PMI not laterally displaced GI- soft, NT, ND, + BS Extremities- no clubbing, cyanosis, or edema MS- no significant deformity or atrophy Skin- no rash or lesion Psych- euthymic mood, full affect Neuro- strength and sensation are intact  EKG-afib at 93 bpm    Assessment and Plan: 1. Atrial  Fibrillation Thought  secondary to recent acute hypoxic respiratory failure and underlying lung disease/ possible multifocal pneumonia  Heart rate is   reasonably controlled on po  Cardizem 180 mg daily   Possibly could consider  cardioversion once on uninterrupted  DOAC x 3 weeks and once  condition stabilizes, will defer to Dr. Eden Emms   when he has cardiology appointment 07/20/20 to further  discuss  most appropriate  afib treatment option going forward  For now  continue rate control  -NoBB given lung disease  2.  CHA2DS2VASc score of 3  Continue eliquis 2.5 mg bid  Reminded not to miss any doses   3. Acute on chronic combined CHF -Echocardiogram this admission showed LV function of 45 to 50%andmoderate LVH. There is the interventricular septum is flattened in systole and diastole, consistent with right ventricular pressure and volume overload. Reduced  RV function.  Treated with IV Lasix 40 mg twice daily  Continue  po lasix 40 mg daily Had labs with PCP this week Not visible in Epic Wife has not heard outcome  Weight is stable compared to d/c weight.   4. Acute hypoxic respiratory failure Per pulmonary team  Pt has continued to feel fatigued/lethargic , pt's wife  is overwhelmed with his care, she has spoken to PCP re [facility] nurse but no one has been out to the house as of yet, hopefully early next week PT has also been scheduled by PCP and will start hopefully next week as well Has f/u with Pulmonology 07/05/19   F/u as above  afib clinic as needed   Lupita Leash C. Matthew Folks [facility] [address] [place], [place] [address] [phone]

## 2020-07-03 ENCOUNTER — Other Ambulatory Visit: Payer: Self-pay | Admitting: Pulmonary Disease

## 2020-07-04 ENCOUNTER — Other Ambulatory Visit: Payer: Self-pay

## 2020-07-04 ENCOUNTER — Ambulatory Visit: Payer: Medicare Other | Admitting: Pulmonary Disease

## 2020-07-04 ENCOUNTER — Encounter: Payer: Self-pay | Admitting: Pulmonary Disease

## 2020-07-04 VITALS — BP 116/74 | HR 60 | Temp 97.8°F

## 2020-07-04 DIAGNOSIS — R911 Solitary pulmonary nodule: Secondary | ICD-10-CM

## 2020-07-04 DIAGNOSIS — J8489 Other specified interstitial pulmonary diseases: Secondary | ICD-10-CM | POA: Diagnosis not present

## 2020-07-04 DIAGNOSIS — R0609 Other forms of dyspnea: Secondary | ICD-10-CM

## 2020-07-04 DIAGNOSIS — R0600 Dyspnea, unspecified: Secondary | ICD-10-CM | POA: Diagnosis not present

## 2020-07-04 MED ORDER — PREDNISONE 10 MG PO TABS: 10.0000 mg | ORAL_TABLET | Freq: Every day | ORAL | 5 refills | Status: AC

## 2020-07-04 NOTE — Progress Notes (Signed)
Synopsis: Hospital follow up  Subjective:   PATIENT ID: Justin Mccarty GENDER: male DOB: [birth year], MRN: [id number]   HPI  Chief Complaint  Patient presents with   Hospitalization Follow-up    HFU from 12/10, states he has been stable since. Breathing improved while on antibiotics. SOB increased around 12/26 once antibiotics were completed. Increased fatigue, per wife he wants to sleep a lot during the day. Still has a cough, more so in the mornings. Unable to cough any phlegm.    Justin Mccarty is an 85 year old male, never smoker with history of GERD who has been following in pulmonary clinic for cough and shortness of breath over recent months. He has been diagnosed with non-specific interstitial pneumonia based on CT imaging in July and October 2021 with pulmonary function tests showing mild restrictive defect and mild diffusion defect.   Inflammatory workup is negative for ANA, RA, non elevated ESR and negative hypersensitivity panel. 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   He has continued to have progressive symptoms over recent months with a hospital admission 12/11 to 12/15 for worsening shortness of breath over the past 2 weeks. He had a new oxygen requirement 2-3L at rest. He was found to be in atrial fibrillation with RVR requiring diltiazem drip.   Chest radiograph was concerning for new scattered bilateral opacities.  BNP was 745, LDH 279, Cr 2.04 (baseline 1.4).  Echo showed EF 45-50%, global hypokinesis. RV systolic function is mildly reduced. RV size is moderately enlarged. Moderately elevated RVSP at 47.51mHg. Left atrial size mildly dilated. Right atrial size was moderately dilated.   He was diuresed for acute heart failure, provided rate control for the atrial fibrillation and treated with steroids along with antibiotics. We discussed the potential for bronchoscopy but this was decided to be to risky  given his progressive lung disease so empiric treatment was provided. He was discharged with a 2 week prednisone taper. His shortness of breath worsened at home around 12/25 and he was then started on prednisone taper again and levaquin by his primary care team. He has felt slightly better but overall his health continues to decline. He has lost 30lbs since the hospital visit. He has episodes of anxiety when ambulating to the bathroom. He has significant oxygen desaturations with ambulation.   He is accompanied by his wife and son today.  Past Medical History:  Diagnosis Date   Acid reflux disease    Acute blood loss anemia    Postoperative   Cellulitis    History of Cellulitis   History of sepsis    Hypercholesterolemia    Mild   Hyponatremia        Osteoarthritis    In the knees   Transfusion history    Status post transfusion without sequelae     Family History  Problem Relation Age of Onset   Bone cancer Mother    Colon cancer Sister      Social History   Socioeconomic History   Marital status: Married    Spouse name: Not on file   Number of children: Not on file   Years of education: Not on file   Highest education level: Not on file  Occupational History   Not on file  Tobacco Use   Smoking status: Never Smoker   Smokeless tobacco: Never Used  Substance and Sexual Activity   Alcohol use: No   Drug use: Never   Sexual activity:  Not Currently  Other Topics Concern   Not on file  Social History Narrative   Not on file   Social Determinants of Health   Financial Resource Strain: Not on file  Food Insecurity: Not on file  Transportation Needs: Not on file  Physical Activity: Not on file  Stress: Not on file  Social Connections: Not on file  Intimate Partner Violence: Not on file     Allergies  Allergen Reactions   Penicillin G Rash     Outpatient Medications Prior to Visit  Medication Sig Dispense Refill   albuterol  (PROVENTIL) (2.5 MG/3ML) 0.083% nebulizer solution USE 3 MLS VIA NEBULIZER EVERY 6 HOURS ASNEEDED FOR WHEEZING OR SHORTNESS OF BREATH 75 mL 3   apixaban (ELIQUIS) 2.5 MG TABS tablet Take 1 tablet (2.5 mg total) by mouth 2 (two) times daily. 60 tablet 0   BREZTRI AEROSPHERE 160-9-4.8 MCG/ACT AERO INHALE 2 PUFFS INTO THE LUNGS IN THE MORNING AND AT BEDTIME 10.7 g 3   busPIRone (BUSPAR) 5 MG tablet Take 1 tablet (5 mg total) by mouth 3 (three) times daily. 90 tablet 1   Cyanocobalamin (VITAMIN B 12 PO) Take 2 capsules by mouth in the morning and at bedtime.      diltiazem (CARDIZEM CD) 180 MG 24 hr capsule Take 1 capsule (180 mg total) by mouth daily. 30 capsule 0   furosemide (LASIX) 40 MG tablet Take 1 tablet (40 mg total) by mouth daily. 30 tablet 1   glucosamine-chondroitin 500-400 MG tablet Take 1 tablet by mouth in the morning and at bedtime.     levofloxacin (LEVAQUIN) 500 MG tablet Take 500 mg by mouth daily.     Multiple Vitamins-Minerals (ICAPS AREDS 2 PO) Take 1 capsule by mouth in the morning and at bedtime.     Omega-3 Fatty Acids (FISH OIL PO) Take 1 capsule by mouth 2 (two) times daily.      pantoprazole (PROTONIX) 40 MG tablet Take 1 tablet (40 mg total) by mouth daily. 30 tablet 0   VITAMIN D PO Take 2 tablets by mouth daily.     predniSONE (DELTASONE) 10 MG tablet Taper: 4/4/3/3/2/2/1/1     HYDROcodone-homatropine (HYCODAN) 5-1.5 MG/5ML syrup Take 5 mLs by mouth every 6 (six) hours as needed for cough. (Patient not taking: Reported on 06/28/2020) 240 mL 0   No facility-administered medications prior to visit.    Review of Systems  Constitutional: Positive for malaise/fatigue and weight loss. Negative for chills, diaphoresis and fever.  HENT: Negative for congestion, sinus pain and sore throat.   Eyes: Negative.   Respiratory: Positive for shortness of breath.   Cardiovascular: Negative for chest pain and leg swelling.  Gastrointestinal: Negative for abdominal  pain, heartburn, nausea and vomiting.  Genitourinary: Negative.   Musculoskeletal: Positive for falls.  Skin: Negative for rash.  Neurological: Positive for weakness.  Endo/Heme/Allergies: Negative.   Psychiatric/Behavioral: Negative.    Objective:   Vitals:   07/04/20 1158  BP: 116/74  Pulse: 60  Temp: 97.8 F (36.6 C)  TempSrc: Temporal  SpO2: 91%    Physical Exam Constitutional:      Appearance: He is ill-appearing.  HENT:     Head: Normocephalic and atraumatic.  Eyes:     General: No scleral icterus.    Conjunctiva/sclera: Conjunctivae normal.  Cardiovascular:     Rate and Rhythm: Rhythm irregular.     Pulses: Normal pulses.     Heart sounds: Normal heart sounds.  Pulmonary:  Breath sounds: Rales present.  Abdominal:     General: Bowel sounds are normal.     Palpations: Abdomen is soft.  Skin:    General: Skin is warm and dry.  Neurological:     General: No focal deficit present.     Mental Status: He is alert.     Motor: Weakness present.  Psychiatric:     Comments: Depressed mood     CBC    Component Value Date/Time   WBC 12.0 (H) 06/14/2020 0216   RBC 3.58 (L) 06/14/2020 0216   HGB 11.1 (L) 06/14/2020 0216   HCT 33.5 (L) 06/14/2020 0216   PLT 249 06/14/2020 0216   MCV 93.6 06/14/2020 0216   MCH 31.0 06/14/2020 0216   MCHC 33.1 06/14/2020 0216   RDW 14.4 06/14/2020 0216   LYMPHSABS 0.8 06/10/2020 0034   MONOABS 0.2 06/10/2020 0034   EOSABS 0.0 06/10/2020 0034   BASOSABS 0.0 06/10/2020 0034   Chest imaging: CT Chest 04/04/20 1. The left upper lobe probable nodule is again identified, felt to be slightly more well-defined and possibly enlarged compared to the exam of 2 and half months ago. This is below PET resolution. Consider chest CT follow-up at 6 months. 2. Similar interstitial lung disease, with differential considerations of hypersensitivity pneumonitis, post infectious/inflammatory fibrosis, and fibrotic nonspecific interstitial  pneumonitis. 3. Similar moderate to marked mediastinal adenopathy, indeterminate. Reactive versus related to a lymphoproliferative process. Recommend attention on follow-up. 4. Coronary artery atherosclerosis. Aortic Atherosclerosis (ICD10-I70.0). 5. Chronic calcific pancreatitis.  PFT: PFT Results Latest Ref Rng & Units 04/20/2020 01/20/2020  FVC-Pre L 2.31 2.46  FVC-Predicted Pre % 66 71  FVC-Post L 2.31 2.44  FVC-Predicted Post % 66 70  Pre FEV1/FVC % % 61 63  Post FEV1/FCV % % 62 68  FEV1-Pre L 1.40 1.56  FEV1-Predicted Pre % 57 64  FEV1-Post L 1.43 1.65  DLCO uncorrected ml/min/mmHg 15.16 15.82  DLCO UNC% % 69 72  DLCO corrected ml/min/mmHg 15.16 15.82  DLCO COR %Predicted % 69 72  DLVA Predicted % 102 110  TLC L 5.20 4.24  TLC % Predicted % 80 65  RV % Predicted % 107 72    Echo: 06/10/2020 1. Left ventricular ejection fraction, by estimation, is 45 to 50%. The  left ventricle has mildly decreased function. The left ventricle  demonstrates global hypokinesis. There is moderate concentric left  ventricular hypertrophy. Left ventricular  diastolic function could not be evaluated. There is the interventricular  septum is flattened in systole and diastole, consistent with right  ventricular pressure and volume overload.  2. Right ventricular systolic function is moderately reduced. The right  ventricular size is moderately enlarged. There is moderately elevated  pulmonary artery systolic pressure. The estimated right ventricular  systolic pressure is 49.6 mmHg.  3. Left atrial size was mildly dilated.  4. Right atrial size was moderately dilated.  5. The mitral valve is normal in structure. No evidence of mitral valve  regurgitation. No evidence of mitral stenosis.  6. The aortic valve is normal in structure. Aortic valve regurgitation is  mild. Mild to moderate aortic valve sclerosis/calcification is present,  without any evidence of aortic stenosis.  7. The  inferior vena cava is dilated in size with <50% respiratory  variability, suggesting right atrial pressure of 15 mmHg.   Lower Extremity US Doppler 06/11/20 No DVT noted  Heart Catheterization:  Assessment & Plan:   NSIP (nonspecific interstitial pneumonia) ([place]) - Plan: Amb Referral to Palliative Care,  AMB REFERRAL FOR DME  Solitary pulmonary nodule  Dyspnea on exertion - Plan: AMB REFERRAL FOR DME  Discussion: Justin Mccarty is an 85 year old male, never smoker with history of GERD who has been following in pulmonary clinic for cough and shortness of breath over recent months. He has been diagnosed with non-specific interstitial pneumonia based on CT imaging in July and October 2021 with pulmonary function tests showing mild restrictive defect and mild diffusion defect.   Inflammatory workup is negative for ANA, RA, non elevated ESR and negative hypersensitivity panel. 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   He has continued to have progressive shortness of breath over recent months and more rapidly over the last 1 month now requiring supplemental oxygen. He has significant weight loss and generalized weakness/deconditioning.   Unfortunately he does not appear to have have responded to steroid therapy and he is likely having rapid progression of the non-specific interstitial/fibrotic lung disease.   I discussed with the family that the outlook and prognosis is limited. I recommended that we consult palliative care which they were open to meeting with them. A referral has been placed.  We will order a higher oxygen concentrator for use at home up to 10L if possible.   Patient is to continue on prednisone 34m daily after he finishes the steroid taper from his PCP until follow up visit in 1 month.   JFreda Jackson MD [facility] Office: [phone]  See Amion for Pager Details    Current Outpatient  Medications:    albuterol (PROVENTIL) (2.5 MG/3ML) 0.083% nebulizer solution, USE 3 MLS VIA NEBULIZER EVERY 6 HOURS ASNEEDED FOR WHEEZING OR SHORTNESS OF BREATH, Disp: 75 mL, Rfl: 3   apixaban (ELIQUIS) 2.5 MG TABS tablet, Take 1 tablet (2.5 mg total) by mouth 2 (two) times daily., Disp: 60 tablet, Rfl: 0   BREZTRI AEROSPHERE 160-9-4.8 MCG/ACT AERO, INHALE 2 PUFFS INTO THE LUNGS IN THE MORNING AND AT BEDTIME, Disp: 10.7 g, Rfl: 3   busPIRone (BUSPAR) 5 MG tablet, Take 1 tablet (5 mg total) by mouth 3 (three) times daily., Disp: 90 tablet, Rfl: 1   Cyanocobalamin (VITAMIN B 12 PO), Take 2 capsules by mouth in the morning and at bedtime. , Disp: , Rfl:    diltiazem (CARDIZEM CD) 180 MG 24 hr capsule, Take 1 capsule (180 mg total) by mouth daily., Disp: 30 capsule, Rfl: 0   furosemide (LASIX) 40 MG tablet, Take 1 tablet (40 mg total) by mouth daily., Disp: 30 tablet, Rfl: 1   glucosamine-chondroitin 500-400 MG tablet, Take 1 tablet by mouth in the morning and at bedtime., Disp: , Rfl:    levofloxacin (LEVAQUIN) 500 MG tablet, Take 500 mg by mouth daily., Disp: , Rfl:    Multiple Vitamins-Minerals (ICAPS AREDS 2 PO), Take 1 capsule by mouth in the morning and at bedtime., Disp: , Rfl:    Omega-3 Fatty Acids (FISH OIL PO), Take 1 capsule by mouth 2 (two) times daily. , Disp: , Rfl:    pantoprazole (PROTONIX) 40 MG tablet, Take 1 tablet (40 mg total) by mouth daily., Disp: 30 tablet, Rfl: 0   predniSONE (DELTASONE) 10 MG tablet, Take 1 tablet (10 mg total) by mouth daily with breakfast., Disp: 30 tablet, Rfl: 5   VITAMIN D PO, Take 2 tablets by mouth daily., Disp: , Rfl:

## 2020-07-04 NOTE — Patient Instructions (Addendum)
We have placed a palliative care consult, their team will contact you.  Continue prednisone 10mg  daily once done with steroid taper and antibiotic from primary care team  Call if you have any questions or need orders for medical equipment.

## 2020-07-05 ENCOUNTER — Telehealth: Payer: Self-pay

## 2020-07-05 NOTE — Telephone Encounter (Signed)
Attempted to contact patient's wife Lucendia Herrlich to schedule a Palliative Care consult appointment. No answer and unable to leave a message. Mailbox is full.

## 2020-07-06 ENCOUNTER — Telehealth: Payer: Self-pay | Admitting: Pulmonary Disease

## 2020-07-06 DIAGNOSIS — R0600 Dyspnea, unspecified: Secondary | ICD-10-CM

## 2020-07-06 DIAGNOSIS — R0609 Other forms of dyspnea: Secondary | ICD-10-CM

## 2020-07-06 NOTE — Telephone Encounter (Signed)
Yes ok to send order for POC.  Thanks, Cletis [place]

## 2020-07-06 NOTE — Telephone Encounter (Signed)
JD please advise if we are able to send over an order to DME for the pt to also get a POC for the pt to use when going out of the house.  Please advise. Thanks

## 2020-07-06 NOTE — Telephone Encounter (Signed)
Order has been placed for the POC per JD.  Nothing further is needed.

## 2020-07-07 ENCOUNTER — Telehealth: Payer: Self-pay | Admitting: Pulmonary Disease

## 2020-07-07 DIAGNOSIS — J9611 Chronic respiratory failure with hypoxia: Secondary | ICD-10-CM

## 2020-07-07 NOTE — Telephone Encounter (Signed)
Pts daughter called in wanting to make sure the 10L concentrator was order . pts daughter states she spoke with [facility] and they dont have the order and pt is currently on 2L as of now . Please advise

## 2020-07-07 NOTE — Telephone Encounter (Signed)
ATC Tim as Im not sure who the daughter is and there is no number listed to call back. Tim and daughter are not listed on the DPR only wife for [place] to speak with. LMTCB

## 2020-07-10 ENCOUNTER — Telehealth: Payer: Self-pay | Admitting: Pulmonary Disease

## 2020-07-10 NOTE — Telephone Encounter (Signed)
Another encounter open for patient.  Closing this one

## 2020-07-10 NOTE — Telephone Encounter (Signed)
New order placed to reflect increased liter flow   I spoke with pt's daughter and notified her that this was done  Pt has f/u planned with Dr Francine Graven for 08/07/20

## 2020-07-10 NOTE — Telephone Encounter (Signed)
Justin Mccarty   [facility]  07/10/20 3:29 PM Note Justin Mccarty daughter is returning phone call. Justin Mccarty phone number is [phone].         TD  07/10/20 3:11 PM Durene Cal M routed this conversation to [facility] Triage Pool    (Name not recorded) to Lorayne Bender   [facility]   07/10/20 3:04 PM [facility] is calling because they receive an order for pt oxgyen. pt daughter told them that JD said for him to have 8 liters, the last order was for 2 liters.just wanted to veirfy due to it being a big jump. please advise [phone]    Copied from new open note  Called [facility] on hold for 10 minutes. Called Melissa let her know the order states 5L and needs concentrator for 10L. Melissa said she would get this to the team and the team would call the daughter Justin Mccarty to discuss time for delivery.

## 2020-07-10 NOTE — Telephone Encounter (Signed)
Justin John- we sent order to [facility] for a 10 lpm concentrator  [facility] has issue with this b/c their last documented o2 order was for 2lpm  Pt is having to increase o2 to 5 lpm and sometimes this is not keeping sats above 90%  Can you please advise if okay to write new order for 10 lpm concentrator for pt to use 5lpm or more to keep sats above 90%  Thanks!

## 2020-07-10 NOTE — Telephone Encounter (Signed)
Justin Mccarty daughter is returning phone call. Justin Mccarty phone number is [phone].

## 2020-07-10 NOTE — Telephone Encounter (Addendum)
Spoke to patient's daughter, Julie(EC) and relayed below message from [place].  Raynelle Fanning provided me with [facility] fax number of [phone] back up fax [phone].  She would like to verify that PCC's are sending order to [facility] at provided fax numbers.   PCC's please advise. Thanks.

## 2020-07-10 NOTE — Telephone Encounter (Signed)
07/10/2020  Per last office visit on 07/04/2020 with Dr. Francine Graven:  We will order a higher oxygen concentrator for use at home up to 10L if possible.   Patient is to continue on prednisone 10mg  daily after he finishes the steroid taper from his PCP until follow up visit in 1 month.   , MD [place] [facility] Office: [phone]  Based off the above documentation I believe it is reasonable for [phone] to place this order.  Please also CC Dr. [place] so that we he is aware.  We will need to follow-up with the patient to ensure that he obtains the appropriate oxygen equipment to ensure that he is able to oxygenate appropriately in the outpatient setting.  Francine Graven, FNP

## 2020-07-10 NOTE — Telephone Encounter (Signed)
[facility] did get the order but there is something wrong with it this is the response I got from [place] at [facility], has the liter flow increased to warrant the change in concentrators? if so, can you have the order corrected to show that? the last order we have he is set at 2LPM, which the 5L concentrator is needed. please advise, and when corrected order is available, i'll create and work the order from there. thanks

## 2020-07-10 NOTE — Telephone Encounter (Signed)
Spoke with the pt's daughter again  She is upset that [facility] has not called them yet and delivered his 10 lpm concentrator  She states that pt keeps feeling like he is going to pass out if he does not get enough o2  She states that also, has not heard from anyone at Palliative care- referral in for that as well  She wants someone to contact her back today with answers as to when pt will get his o2 and palliative care eval  I advised will send to [facility] to see if they can help with this, and also that if pt gets worse in the meantime needs to call 911 or take him to ED

## 2020-07-10 NOTE — Telephone Encounter (Signed)
I looked in the chart and Pallitive called the wife on 07/05/2020 unable to leave a message mailbox is full I have given them th daughter's number and name to call   Oxygen order has been sent by Oneita Jolly to [facility] I have also sent it to just make sure they got it  I have left a message for the daughter

## 2020-07-10 NOTE — Telephone Encounter (Signed)
Spoke with pt and he gave verbal okay to speak with Raynelle Fanning, pt's daughter   Per Raynelle Fanning [facility] never received the order that we sent for a 10 lpm concentrator  He has been having to turn up his o2 with exertion  She states not sure what his sats drop to, but when pt walks he "hyperventilates and almost goes out"   PCC's can you please help with this, the order was placed on 07/04/20, thanks!

## 2020-07-17 NOTE — Progress Notes (Deleted)
Cardiology Office Note   Date:  07/17/2020   ID:  Justin Mccarty, Justin Mccarty March 18, 1936, MRN [id number]  PCP:  Adrienne Mocha, PA  Cardiologist: Dr. Hillis Range, MD  No chief complaint on file.     History of Present Illness: Justin Mccarty is a 85 y.o. male who presents for hospital follow-up, seen for Dr. Johney Frame.  Justin Mccarty has a history of nonspecific interstitial lung disease followed by pulmonary medicine, GERD, hyponatremia and anemia who was recently sent to the ED by his pulmonologist for hypoxia admitted with new onset atrial fibrillation with RVR and lower lung PNA.  He was treated with prednisone taper, IV diltiazem which was subsequently transitioned to p.o. dosing.  He was discharged on Eliquis 2.5 mg twice daily and PO diltiazem 180mg . No beta blocker due to significant lung disease.   He was seen in follow-up with , NP on 06/28/2020 at which time he was in rate controlled atrial fibrillation.  He continued to feel fatigued and short of breath with intermittent wheezing.  He was continued on p.o. diltiazem 180 mg daily.  Plan was to consider DCCV once on uninterrupted DOAC x3 weeks and once underlying pulmonary condition stabilizes.  He was not placed on beta-blocker therapy due to lung disease.  One.  Atrial fibrillation: -Felt to be secondary to severe lung disease and recent acute hypoxic respiratory failure and possible multifocal pneumonia.  He was treated with p.o. diltiazem 180 mg along with Eliquis 2.5 mg twice daily.  He was seen in the outpatient setting 06/28/2020 at which time he remained in atrial fibrillation with rate control.  Plan was for close follow-up and potential DCCV once on adequate anticoagulation therapy and if underlying acute process stabilized   Two.  Acute on chronic combined CHF: -Echocardiogram during most recent hospitalization with LVEF at 45 to 50% with moderate LVH with interventricular septum flattening consistent with right  ventricular pressure and volume overload.  He was treated with IV Lasix 40 mg twice daily which was transitioned to Lasix p.o. daily -Does not appear to be significantly fluid volume overloaded on exam today -Weight,  Three.  Acute hypoxic respiratory failure: -Follows with outpatient pulmonary medicine> most recently seen 07/05/2019 at which time discussion regarding limited prognosis with recommendations for palliative consultation.  He was continued on supplemental home O2 along with continued prednisone therapy        Past Medical History:  Diagnosis Date  . Acid reflux disease   . Acute blood loss anemia    Postoperative  . Cellulitis    History of Cellulitis  . History of sepsis   . Hypercholesterolemia    Mild  . Hyponatremia       . 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 Transfusion history    Status post transfusion without sequelae    Past Surgical History:  Procedure Laterality Date  . HERNIA REPAIR     Abdominal  . KNEE SURGERY    . TRANSURETHRAL RESECTION OF PROSTATE       Current Outpatient Medications  Medication Sig Dispense Refill  . albuterol (PROVENTIL) (2.5 MG/3ML) 0.083% nebulizer solution USE 3 MLS VIA NEBULIZER EVERY 6 HOURS ASNEEDED FOR WHEEZING OR SHORTNESS OF BREATH 75 mL 3  . apixaban (ELIQUIS) 2.5 MG TABS tablet Take 1 tablet (2.5 mg total) by mouth 2 (two) times daily. 60 tablet 0  . BREZTRI AEROSPHERE 160-9-4.8 MCG/ACT AERO INHALE 2 PUFFS INTO THE LUNGS IN THE MORNING  AND AT BEDTIME 10.7 g 3  . busPIRone (BUSPAR) 5 MG tablet Take 1 tablet (5 mg total) by mouth 3 (three) times daily. 90 tablet 1  . Cyanocobalamin (VITAMIN B 12 PO) Take 2 capsules by mouth in the morning and at bedtime.     Marland Kitchen diltiazem (CARDIZEM CD) 180 MG 24 hr capsule Take 1 capsule (180 mg total) by mouth daily. 30 capsule 0  . furosemide (LASIX) 40 MG tablet Take 1 tablet (40 mg total) by mouth daily. 30 tablet 1  . glucosamine-chondroitin 500-400 MG tablet Take 1 tablet by  mouth in the morning and at bedtime.    Marland Kitchen levofloxacin (LEVAQUIN) 500 MG tablet Take 500 mg by mouth daily.    . Multiple Vitamins-Minerals (ICAPS AREDS 2 PO) Take 1 capsule by mouth in the morning and at bedtime.    . Omega-3 Fatty Acids (FISH OIL PO) Take 1 capsule by mouth 2 (two) times daily.     . pantoprazole (PROTONIX) 40 MG tablet Take 1 tablet (40 mg total) by mouth daily. 30 tablet 0  . predniSONE (DELTASONE) 10 MG tablet Take 1 tablet (10 mg total) by mouth daily with breakfast. 30 tablet 5  . VITAMIN D PO Take 2 tablets by mouth daily.     No current facility-administered medications for this visit.    Allergies:   Penicillin g    Social History:  The patient  reports that he has never smoked. He has never used smokeless tobacco. He reports that he does not drink alcohol and does not use drugs.   Family History:  The patient's ***family history includes Bone cancer in his mother; Colon cancer in his sister.    ROS:  Please see the history of present illness.   Otherwise, review of systems are positive for {NONE DEFAULTED:18576::"none"}.   All other systems are reviewed and negative.    PHYSICAL EXAM: VS:  There were no vitals taken for this visit. , BMI There is no height or weight on file to calculate BMI. GEN: Well nourished, well developed, in no acute distress HEENT: normal Neck: no JVD, carotid bruits, or masses Cardiac: ***RRR; no murmurs, rubs, or gallops,no edema  Respiratory:  clear to auscultation bilaterally, normal work of breathing GI: soft, nontender, nondistended, + BS MS: no deformity or atrophy Skin: warm and dry, no rash Neuro:  Strength and sensation are intact Psych: euthymic mood, full affect   EKG:  EKG {ACTION; IS/IS XEN:40768088} ordered today. The ekg ordered today demonstrates ***   Recent Labs: 06/09/2020: B Natriuretic Peptide 745.0 06/10/2020: TSH 1.449 06/14/2020: ALT 175; BUN 51; Creatinine, Ser 1.82; Hemoglobin 11.1; Magnesium  2.1; Platelets 249; Potassium 4.8; Sodium 139    Lipid Panel No results found for: CHOL, TRIG, HDL, CHOLHDL, VLDL, LDLCALC, LDLDIRECT    Wt Readings from Last 3 Encounters:  06/28/20 156 lb 12.8 oz (71.1 kg)  06/14/20 181 lb 10.5 oz (82.4 kg)  06/06/20 184 lb 12.8 oz (83.8 kg)      Other studies Reviewed: Additional studies/ records that were reviewed today include: ***. Review of the above records demonstrates: ***   ASSESSMENT AND PLAN:  1.  ***   Current medicines are reviewed at length with the patient today.  The patient {ACTIONS; HAS/DOES NOT HAVE:19233} concerns regarding medicines.  The following changes have been made:  {PLAN; NO CHANGE:13088:s}  Labs/ tests ordered today include: *** No orders of the defined types were placed in this encounter.    Disposition:  FU with *** in {gen number 0-73:710626} {Days to years:10300}  Signed, Georgie Chard, NP  07/17/2020 6:54 AM    [facility] [address] [place], [place], [place]  [address] Phone: [phone]; Fax: [phone]

## 2020-07-20 ENCOUNTER — Ambulatory Visit: Payer: Medicare Other | Admitting: Cardiology

## 2020-07-21 NOTE — Telephone Encounter (Signed)
Patient expired 2020/07/30 per chart.  Will close this encounter.

## 2020-08-01 NOTE — Telephone Encounter (Signed)
These orders are not faxed to [facility] that is the number they give when you call the main number which is a call center we send ours through Epic we got a response from yesterday from [facility] @ 3:37 that they have the order we use our local contacts to send the orders to

## 2020-08-01 NOTE — Telephone Encounter (Signed)
Please advise on patient mychart message  We need palliative care- they called on Jan 5th, but when we did not answer, they have not called back. We do not know how to get in touch with them. He is struggling, confused, agitated, and anxious. He has passed out several times when exerting himself from lack of oxygen. We have the 10 L concentrator but he is deteriorating rapidly.   [facility]

## 2020-08-01 NOTE — Telephone Encounter (Signed)
atc daughter Raynelle Fanning to see if they heard from [employer]. As my last message states Melissa from [employer] was working on it and would get someone to call patient to delivery.  We do not fax anything to [employer] it is all done through the computer system Epic.  Unable to reach her, left message to call back

## 2020-08-01 DEATH — deceased

## 2020-08-07 ENCOUNTER — Ambulatory Visit: Payer: Medicare Other | Admitting: Pulmonary Disease

## 2021-04-13 IMAGING — CT CT CHEST W/O CM
2 of 4 series · 15 of 36 positions shown, 18 images · non-contrast
Comparison: 01/20/2020

CLINICAL DATA: Follow-up of possible left upper lobe pulmonary
nodule. Six month history of shortness of breath.

EXAM:
CT CHEST WITHOUT CONTRAST
TECHNIQUE: Multidetector CT imaging of the chest was performed following the
standard protocol without IV contrast.

[Series 2: thorax · axial · 0.78mm/px · z∈[-312,-52]mm · 12 of 154 slices shown, 15 images]
[im 12/154  mediastinal]
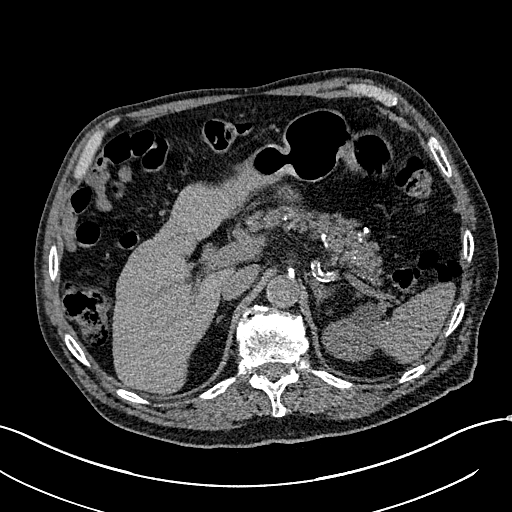
[im 12/154  lung]
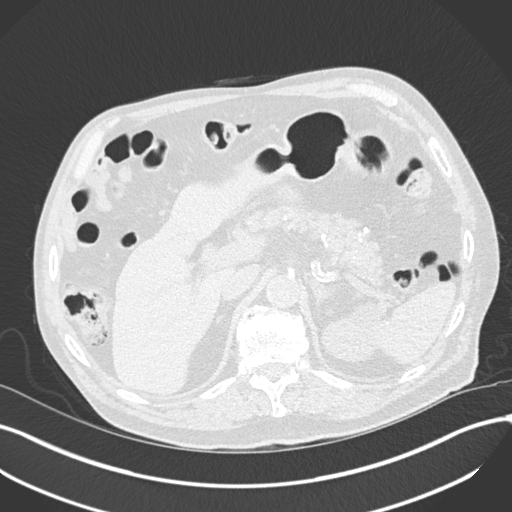
[im 24/154  lung]
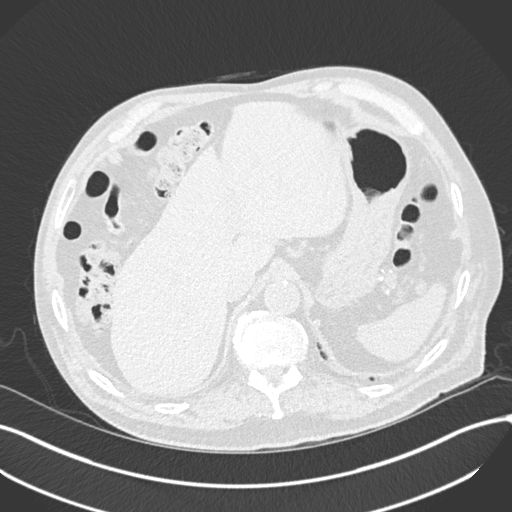
[im 36/154  lung]
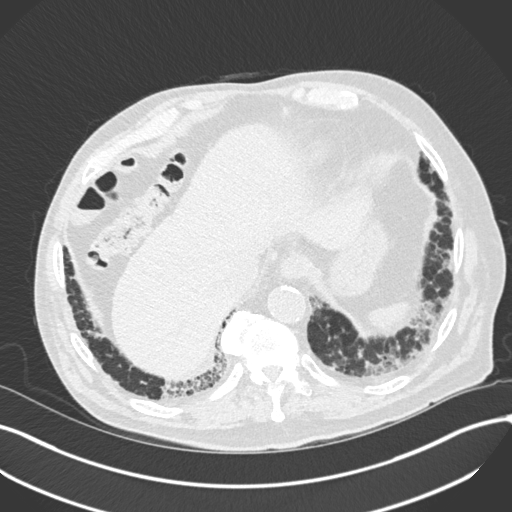
[im 48/154  lung]
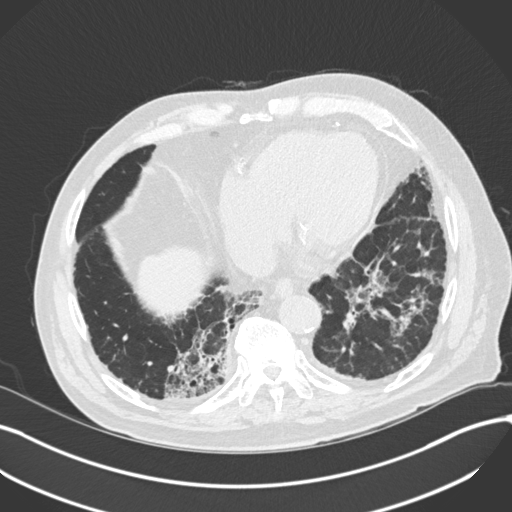
[im 59/154  mediastinal]
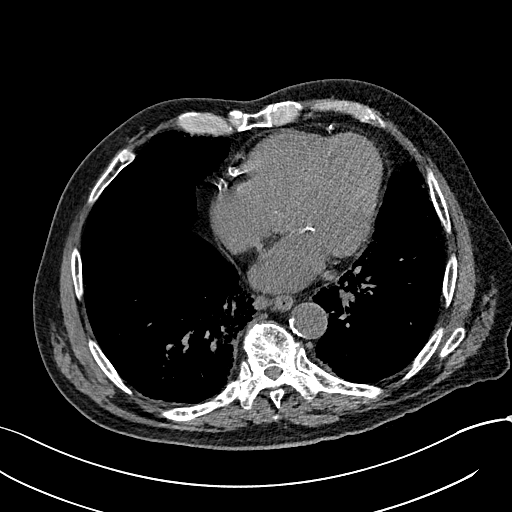
[im 59/154  lung]
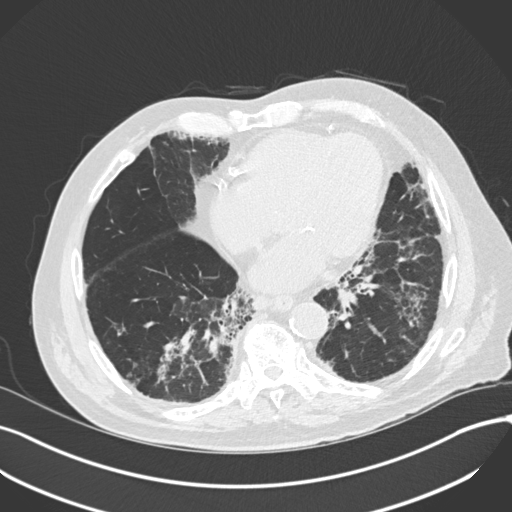
[im 71/154  lung]
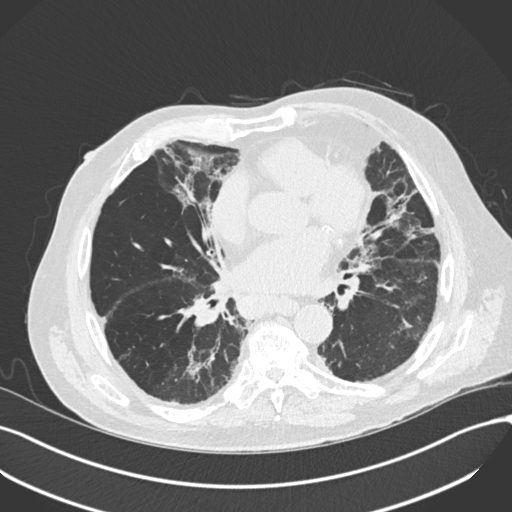
[im 83/154  lung]
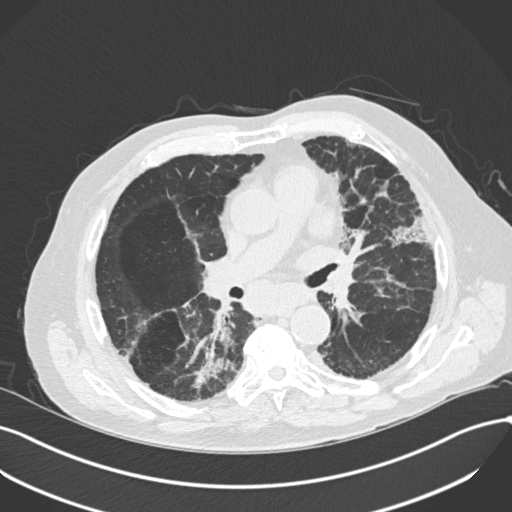
[im 95/154  lung]
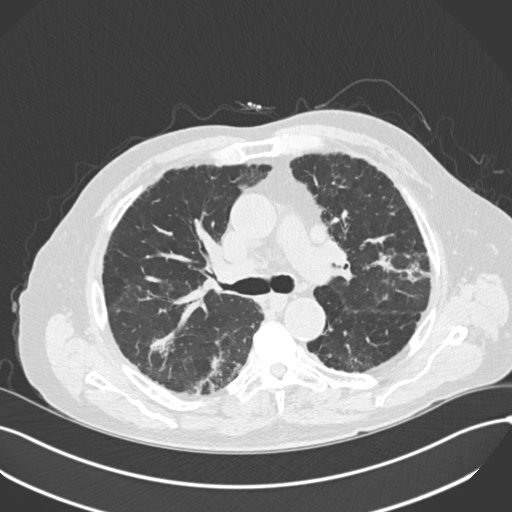
[im 106/154  mediastinal]
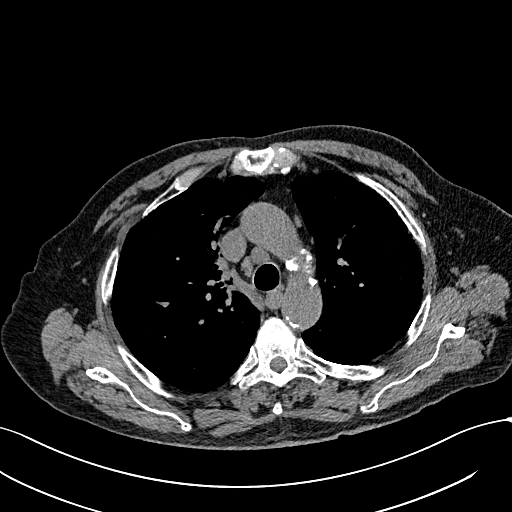
[im 106/154  lung]
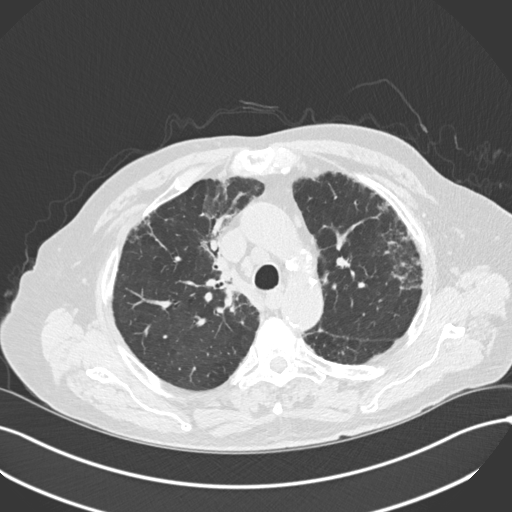
[im 118/154  lung]
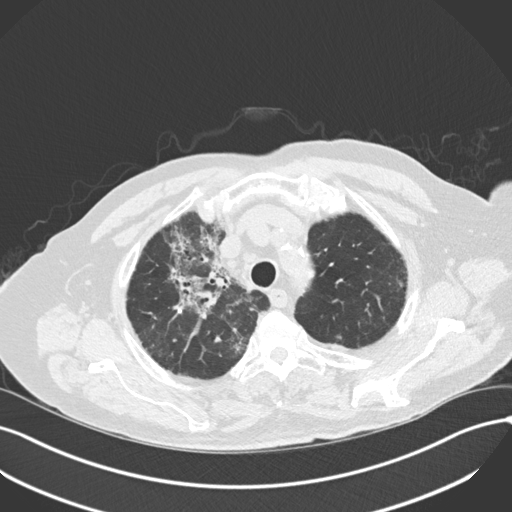
[im 130/154  lung]
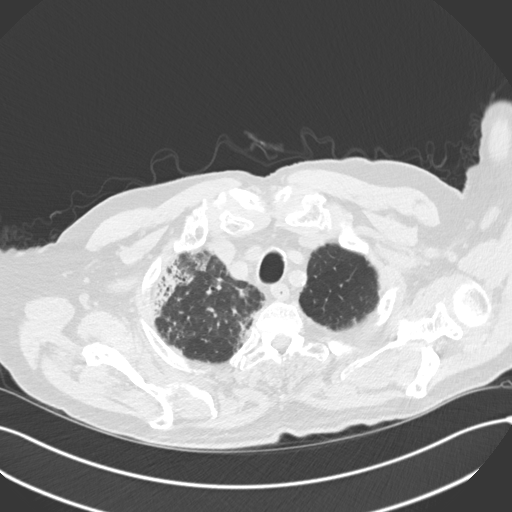
[im 142/154  lung]
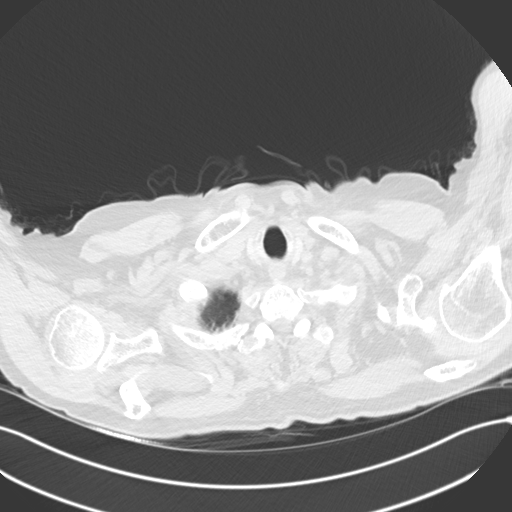

[Series 5: coronal · coronal · 0.60mm/px · 3 of 136 slices shown]
[im 28/136  lung]
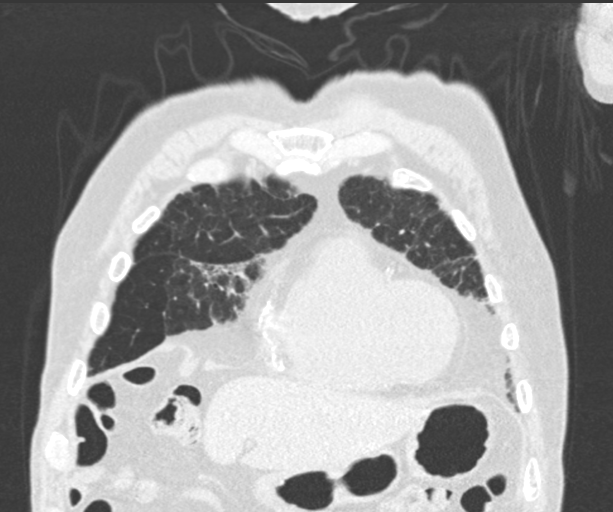
[im 55/136  lung]
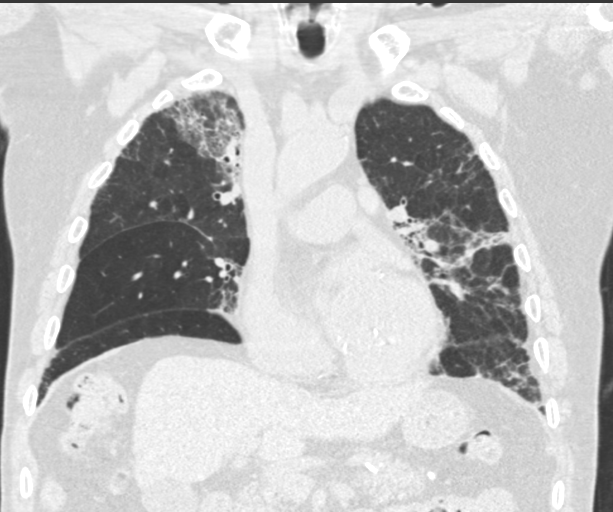
[im 82/136  lung]
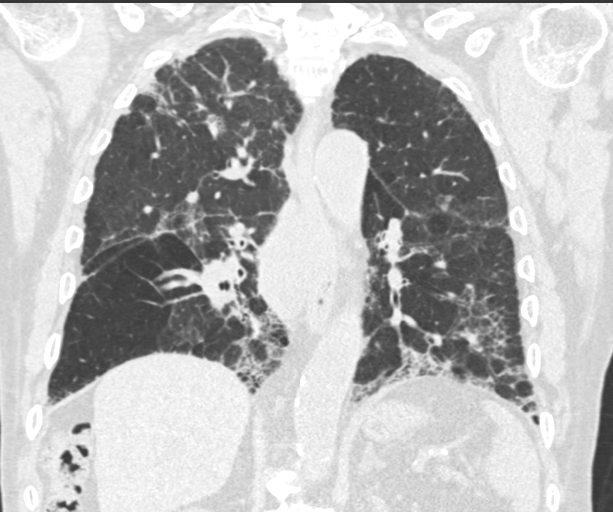

[15 of 36 positions shown; findings below may reference images not displayed]

FINDINGS: Cardiovascular: Aortic and branch vessel atherosclerosis. Mild
cardiomegaly. Multivessel coronary artery atherosclerosis.

Mediastinum/Nodes: Bulky mediastinal adenopathy. AP window index
node measures 1.4 cm on 54/2 and is similar 1.5 cm on the prior.

Subcarinal node measures 2.5 cm on 69/2 versus 2.6 cm on the prior.
Hilar regions poorly evaluated without intravenous contrast.

Lungs/Pleura: No pleural fluid.  Scattered calcified granulomas.

Similar appearance of patchy areas of bilateral ground-glass and
traction bronchiectasis. No definite craniocaudal gradient. No
subpleural reticulation.

Bilateral pulmonary nodules are similar. Examples in the left upper
lobe at 4 mm on 38/3, right lower lobe at 4 mm on 78/3 and right
middle lobe at 4 mm on 90/3.

The subtle central left upper lobe peribronchovascular nodule is
again identified. On the order of 7 x 7 mm on 47/3. Possibly
slightly more distinct than at 7 x 5 mm on the prior exam.

Upper Abdomen: Normal imaged portions of the liver, spleen, stomach,
right kidney. Right adrenal calcification likely relates to remote
infection or hemorrhage. Left adrenal thickening. Upper pole left
renal 1.8 cm low-density lesion is likely a cyst. Pancreatic
parenchymal calcifications likely represent chronic calcific
pancreatitis.

Musculoskeletal: No acute osseous abnormality.
IMPRESSION: 1. The left upper lobe probable nodule is again identified, felt to
be slightly more well-defined and possibly enlarged compared to the
exam of 2 and half months ago. This is below PET resolution.
Consider chest CT follow-up at 6 months.
2. Similar interstitial lung disease, with differential
considerations of hypersensitivity pneumonitis, post
infectious/inflammatory fibrosis, and fibrotic nonspecific
interstitial pneumonitis.
3. Similar moderate to marked mediastinal adenopathy, indeterminate.
Reactive versus related to a lymphoproliferative process. Recommend
attention on follow-up.
4. Coronary artery atherosclerosis. Aortic Atherosclerosis
([id number]).
5. Chronic calcific pancreatitis.
